# Patient Record
Sex: Female | Born: 1985 | Race: White | Hispanic: No | Marital: Married | Smoking: Never smoker
Health system: Southern US, Community
[De-identification: ages and names within clinical notes are randomized; demographics above are authoritative.]

## PROBLEM LIST (undated history)

## (undated) DIAGNOSIS — E282 Polycystic ovarian syndrome: Secondary | ICD-10-CM

## (undated) DIAGNOSIS — F419 Anxiety disorder, unspecified: Secondary | ICD-10-CM

## (undated) DIAGNOSIS — R51 Headache: Secondary | ICD-10-CM

## (undated) DIAGNOSIS — E039 Hypothyroidism, unspecified: Secondary | ICD-10-CM

## (undated) DIAGNOSIS — IMO0001 Reserved for inherently not codable concepts without codable children: Secondary | ICD-10-CM

## (undated) DIAGNOSIS — M1990 Unspecified osteoarthritis, unspecified site: Secondary | ICD-10-CM

## (undated) DIAGNOSIS — I499 Cardiac arrhythmia, unspecified: Secondary | ICD-10-CM

## (undated) DIAGNOSIS — K219 Gastro-esophageal reflux disease without esophagitis: Secondary | ICD-10-CM

## (undated) DIAGNOSIS — R569 Unspecified convulsions: Secondary | ICD-10-CM

## (undated) DIAGNOSIS — E669 Obesity, unspecified: Secondary | ICD-10-CM

## (undated) HISTORY — DX: Gastro-esophageal reflux disease without esophagitis: K21.9

## (undated) HISTORY — DX: Unspecified osteoarthritis, unspecified site: M19.90

## (undated) HISTORY — PX: WRIST FRACTURE SURGERY: SHX121

## (undated) HISTORY — DX: Polycystic ovarian syndrome: E28.2

## (undated) HISTORY — DX: Headache: R51

## (undated) HISTORY — DX: Hypothyroidism, unspecified: E03.9

## (undated) HISTORY — DX: Reserved for inherently not codable concepts without codable children: IMO0001

## (undated) HISTORY — DX: Anxiety disorder, unspecified: F41.9

## (undated) HISTORY — DX: Obesity, unspecified: E66.9

## (undated) HISTORY — PX: WISDOM TOOTH EXTRACTION: SHX21

---

## 2010-07-27 ENCOUNTER — Ambulatory Visit: Payer: Self-pay | Admitting: Obstetrics & Gynecology

## 2010-07-28 ENCOUNTER — Encounter: Payer: Self-pay | Admitting: Obstetrics & Gynecology

## 2010-07-28 LAB — CONVERTED CEMR LAB
Cholesterol: 134 mg/dL (ref 0–200)
Glucose, Bld: 98 mg/dL (ref 70–99)
HDL: 47 mg/dL (ref >39)
TSH: 1.503 microintl units/mL (ref 0.350–4.500)
Total CHOL/HDL Ratio: 2.9
VLDL: 18 mg/dL (ref 0–40)

## 2010-08-03 ENCOUNTER — Ambulatory Visit: Payer: Self-pay | Admitting: Obstetrics & Gynecology

## 2010-10-03 ENCOUNTER — Ambulatory Visit: Payer: Self-pay | Admitting: Advanced Practice Midwife

## 2011-04-25 NOTE — Assessment & Plan Note (Signed)
NAMESTARLA, DELLER                    ACCOUNT NO.:  [id number]   MEDICAL RECORD NO.:  [id number]          PATIENT TYPE:  POB   LOCATION:  [facility] at [place]         FACILITY:  [facility]   PHYSICIAN:  Allie Bossier, MD        DATE OF BIRTH:  08-25-1986   DATE OF SERVICE:  07/27/2010                                  CLINIC NOTE   Ms. Caryn Section is a 25 year old single white gravida 0 who is seen here for GYN  exam.  Her GYN concerns are that she recently stopped her birth control  pill because her periods were becoming lengthier and heavier on the  pill.  Please note she had been placed on the pill by her primary care  doctor for the diagnosis of PCOS, to treat her oligomenorrhea followed  by dysfunctional uterine bleeding.  She is currently using withdrawal  for birth control.   PAST MEDICAL HISTORY:  Obesity and PCOS.   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  She is sexually active and monogamous for the last  18 years.  She denies dyspareunia.  The rest of review of systems  questions are negative.  Her last Pap smear was August 2010 and that she  has always had normal Pap smears.  She tells me she has lost 30 pounds  in the last 6 months by changing her eating habits and working out.   MEDICATIONS:  Advil p.r.n.   PAST SURGICAL HISTORY:  Left wrist ORIF.   FAMILY HISTORY:  Negative for breast, GYN and colon malignancies,  coronary artery disease, and skin cancer in her family.   She is allergic to LATEX.  Medicine allergy will be SULFA.   SOCIAL HISTORY:  She reports rare/social alcohol.  She denies tobacco or  illegal drug use.   PHYSICAL EXAMINATION:  VITAL SIGNS:  Height 5 feet 2 inches, weight 230  pounds, blood pressure 127/77, pulse 81.  HEART:  Regular rate and rhythm.  LUNGS:  Clear to auscultation bilaterally.  BREASTS:  Normal bilaterally.  ABDOMEN:  Obese.  No palpable hepatosplenomegaly.  PELVIC:  External genitalia shaved.  No  lesions.  Cervix, nulliparous.  Normal discharge.  Uterus is upper limits of normal for size, normal  shape and mobile and nontender.  Adnexa are nontender.  No masses.   ASSESSMENT AND PLAN:  1. Annual exam.  Checked a Pap smear.  Recommended continued weight      loss.  Recommended self-breast and self-vulvar exams monthly.  2. Health maintenance.  I have recommended the Gardasil vaccination      and given her information.  3. polycystic ovarian syndrome.  She will return for fasting lipids      and TSH and glucose.  4. Control of oligomenorrhea/birth control.  Given her information on      Mirena intrauterine device and said that if the Mirena does not      suit her, I would suggest that she restart the birth control pills.      Allie Bossier, MD     MCD/MEDQ  D:  07/27/2010  T:  07/28/2010  Job:  [id number]

## 2011-04-25 NOTE — Assessment & Plan Note (Signed)
NAMEMAKARI, SANKO NO.:  [id number]   MEDICAL RECORD NO.:  [id number]          PATIENT TYPE:  POB   LOCATION:  [facility] at [place]         FACILITY:  [facility]   PHYSICIAN:  Wynelle Bourgeois, CNM    DATE OF BIRTH:  1986-02-01   DATE OF SERVICE:  10/03/2010                                  CLINIC NOTE   This is a 25 year old gravida 0 who presents for followup of new start  on birth control pills.  She was seen in August by Dr. Marice Potter for  polycystic ovarian syndrome and dysfunctional uterine bleeding and Pap  smear was done at that time, weight loss was recommended and she had  some labs done and they discussed birth control pills and Mirena IUD.  The patient decided to start taking Lo/Ovral birth control pills.  She  took them for 1 month during September and had her period as expected on  October 7, but states that she has continued to have spotting, brown and  red since the end of her period.  She does admit to taking only the  active pills in the pack and not taking the week of inactive pills.  She  then starts her new pack immediately after finishing third week of the  previous pack.  She also requests her second Gardasil vaccination today.   OBJECTIVE:  VITAL SIGNS:  Pulse 77, blood pressure 111/79, weight 217  which is down from 230 in August, height 62 inches.  ABDOMEN:  Soft and nontender.  PELVIC:  Deferred per patient preference.  She states that her bleeding  is only light and it is mostly brown.   LABORATORY DATA:  Her cholesterol was 134, triglycerides 89, HDL 47, LDL  69, all within normal limits.  Glucose was 98 and TSH was normal at  1.503.   ASSESSMENT:  1. Polycystic ovarian syndrome  2. Dysfunctional bleeding secondary to polycystic ovarian syndrome.  3. Oral contraceptive pill use, here for blood pressure check.   PLAN:  We discussed continuous use of birth control pills and the fact  that they may contribute to irregular bleeding due to  inadequate  shedding of the lining.  The patient states that if she lets herself  have a period at the end of the pack that the bleeding will be so heavy  that she cannot endure it.  I suggested that she let herself have a full  period at least every 2 to 3 months just to make sure that her uterus is  getting cleaned out.  We also discussed PCOS and the oligoovulation that  accompanies that and the patient understands and wants to continue the  Lo/Ovral birth control pills.  We discussed Mirena IUD again and the  patient does not want to proceed with that at this time, and so we will  continue the previous plan.  The patient will continue her weight loss  efforts, which will probably improve some of her PCOS symptoms, and we  will see her back as needed for her annual exam.          ______________________________  Wynelle Bourgeois, CNM    MW/MEDQ  D:  10/03/2010  T:  10/04/2010  Job:  [id number]

## 2011-09-12 ENCOUNTER — Encounter: Payer: Self-pay | Admitting: *Deleted

## 2011-09-12 ENCOUNTER — Ambulatory Visit (INDEPENDENT_AMBULATORY_CARE_PROVIDER_SITE_OTHER): Payer: Commercial Managed Care - PPO | Admitting: Obstetrics & Gynecology

## 2011-09-12 ENCOUNTER — Encounter: Payer: Self-pay | Admitting: Obstetrics & Gynecology

## 2011-09-12 VITALS — BP 113/77 | HR 79 | Temp 98.3°F | Resp 16 | Ht 62.0 in | Wt 198.0 lb

## 2011-09-12 DIAGNOSIS — Z1272 Encounter for screening for malignant neoplasm of vagina: Secondary | ICD-10-CM

## 2011-09-12 DIAGNOSIS — Z01419 Encounter for gynecological examination (general) (routine) without abnormal findings: Secondary | ICD-10-CM

## 2011-09-12 DIAGNOSIS — Z0000 Encounter for general adult medical examination without abnormal findings: Secondary | ICD-10-CM

## 2011-09-12 DIAGNOSIS — Z113 Encounter for screening for infections with a predominantly sexual mode of transmission: Secondary | ICD-10-CM

## 2011-09-12 NOTE — Progress Notes (Signed)
  Subjective:    Patient ID: Kristy Rodgers, female    DOB: [birth year], 25 y.o.   MRN: [id number]  HPI    Review of Systems     Objective:   Physical Exam        Assessment & Plan:   Subjective:    Kristy Rodgers is a 25 y.o. female who presents for an annual exam. The patient has no complaints today. The patient is sexually active. GYN screening history: last pap: was normal. The patient wears seatbelts: yes. The patient participates in regular exercise: yes. Has the patient ever been transfused or tattooed?: yes. (tattoo) The patient reports that there is not domestic violence in her life. She has a new sexual partner since 12/11.  They live together. She has occasional dysparunia.  Menstrual History: OB History    Grav Para Term Preterm Abortions TAB SAB Ect Mult Living   0 0 0 0 0 0 0 0 0 0       Menarche age: **51 Patient's last menstrual period was 08/27/2011.    The following portions of the patient's history were reviewed and updated as appropriate: allergies, current medications, past family history, past medical history, past social history, past surgical history and problem list.  Review of Systems A comprehensive review of systems was negative.   She is now a Sales executive. She has not skipped any periods this year in spite of stopping her OCPs. Objective:    BP 113/77  Pulse 79  Temp(Src) 98.3 F (36.8 C) (Oral)  Resp 16  Ht 5\' 2"  (1.575 m)  Wt 198 lb (89.812 kg)  BMI 36.21 kg/m2  LMP 08/27/2011  General Appearance:    Alert, cooperative, no distress, appears stated age  Head:    Normocephalic, without obvious abnormality, atraumatic  Eyes:    PERRL, conjunctiva/corneas clear, EOM's intact, fundi    benign, both eyes  Ears:    Normal TM's and external ear canals, both ears  Nose:   Nares normal, septum midline, mucosa normal, no drainage    or sinus tenderness  Throat:   Lips, mucosa, and tongue normal; teeth and gums normal  Neck:   Supple,  symmetrical, trachea midline, no adenopathy;    thyroid:  no enlargement/tenderness/nodules; no carotid   bruit or JVD  Back:     Symmetric, no curvature, ROM normal, no CVA tenderness  Lungs:     Clear to auscultation bilaterally, respirations unlabored  Chest Wall:    No tenderness or deformity   Heart:    Regular rate and rhythm, S1 and S2 normal, no murmur, rub   or gallop  Breast Exam:    No tenderness, masses, or nipple abnormality  Abdomen:     Soft, non-tender, bowel sounds active all four quadrants,    no masses, no organomegaly  Genitalia:    Normal female without lesion, discharge or tenderness, NSSA, NT, no adnexal masses  Rectal:      Extremities:   Extremities normal, atraumatic, no cyanosis or edema  Pulses:   2+ and symmetric all extremities  Skin:   Skin color, texture, turgor normal, no rashes or lesions  Lymph nodes:   Cervical, supraclavicular, and axillary nodes normal  Neurologic:   CNII-XII intact, normal strength, sensation and reflexes    throughout  .    Assessment:    Healthy female exam.    Plan:     Await pap smear results.  Rec SBE/SVE

## 2012-10-08 ENCOUNTER — Ambulatory Visit: Payer: Commercial Managed Care - PPO | Admitting: Obstetrics & Gynecology

## 2013-02-12 ENCOUNTER — Encounter (HOSPITAL_BASED_OUTPATIENT_CLINIC_OR_DEPARTMENT_OTHER): Payer: Self-pay

## 2013-02-12 ENCOUNTER — Emergency Department (HOSPITAL_BASED_OUTPATIENT_CLINIC_OR_DEPARTMENT_OTHER)
Admission: EM | Admit: 2013-02-12 | Discharge: 2013-02-12 | Disposition: A | Discharge status: Home / Self Care | Payer: Commercial Managed Care - PPO | Attending: Emergency Medicine | Admitting: Emergency Medicine

## 2013-02-12 ENCOUNTER — Telehealth: Payer: Self-pay | Admitting: *Deleted

## 2013-02-12 ENCOUNTER — Emergency Department (HOSPITAL_BASED_OUTPATIENT_CLINIC_OR_DEPARTMENT_OTHER): Payer: Commercial Managed Care - PPO

## 2013-02-12 DIAGNOSIS — Z8719 Personal history of other diseases of the digestive system: Secondary | ICD-10-CM | POA: Insufficient documentation

## 2013-02-12 DIAGNOSIS — Z3202 Encounter for pregnancy test, result negative: Secondary | ICD-10-CM | POA: Insufficient documentation

## 2013-02-12 DIAGNOSIS — Z8639 Personal history of other endocrine, nutritional and metabolic disease: Secondary | ICD-10-CM | POA: Insufficient documentation

## 2013-02-12 DIAGNOSIS — G40909 Epilepsy, unspecified, not intractable, without status epilepticus: Secondary | ICD-10-CM | POA: Insufficient documentation

## 2013-02-12 DIAGNOSIS — R109 Unspecified abdominal pain: Secondary | ICD-10-CM | POA: Insufficient documentation

## 2013-02-12 DIAGNOSIS — N949 Unspecified condition associated with female genital organs and menstrual cycle: Secondary | ICD-10-CM | POA: Insufficient documentation

## 2013-02-12 DIAGNOSIS — N946 Dysmenorrhea, unspecified: Secondary | ICD-10-CM | POA: Insufficient documentation

## 2013-02-12 DIAGNOSIS — Z8742 Personal history of other diseases of the female genital tract: Secondary | ICD-10-CM | POA: Insufficient documentation

## 2013-02-12 DIAGNOSIS — E669 Obesity, unspecified: Secondary | ICD-10-CM | POA: Insufficient documentation

## 2013-02-12 DIAGNOSIS — N898 Other specified noninflammatory disorders of vagina: Secondary | ICD-10-CM | POA: Insufficient documentation

## 2013-02-12 DIAGNOSIS — Z862 Personal history of diseases of the blood and blood-forming organs and certain disorders involving the immune mechanism: Secondary | ICD-10-CM | POA: Insufficient documentation

## 2013-02-12 DIAGNOSIS — Z79899 Other long term (current) drug therapy: Secondary | ICD-10-CM | POA: Insufficient documentation

## 2013-02-12 DIAGNOSIS — Z8739 Personal history of other diseases of the musculoskeletal system and connective tissue: Secondary | ICD-10-CM | POA: Insufficient documentation

## 2013-02-12 DIAGNOSIS — F411 Generalized anxiety disorder: Secondary | ICD-10-CM | POA: Insufficient documentation

## 2013-02-12 HISTORY — DX: Unspecified convulsions: R56.9

## 2013-02-12 LAB — URINALYSIS, ROUTINE W REFLEX MICROSCOPIC
Bilirubin Urine: NEGATIVE
Glucose, UA: NEGATIVE mg/dL
Specific Gravity, Urine: 1.005 (ref 1.005–1.030)
Urobilinogen, UA: 0.2 mg/dL (ref 0.0–1.0)

## 2013-02-12 LAB — COMPREHENSIVE METABOLIC PANEL
CO2: 24 mEq/L (ref 19–32)
Calcium: 9 mg/dL (ref 8.4–10.5)
Creatinine, Ser: 0.7 mg/dL (ref 0.50–1.10)
GFR calc Af Amer: >90 mL/min (ref >90)
GFR calc non Af Amer: >90 mL/min (ref >90)
Glucose, Bld: 104 mg/dL — ABNORMAL HIGH (ref 70–99)

## 2013-02-12 LAB — CBC WITH DIFFERENTIAL/PLATELET
Basophils Relative: 0 % (ref 0–1)
Hemoglobin: 12.8 g/dL (ref 12.0–15.0)
Lymphocytes Relative: 38 % (ref 12–46)
Lymphs Abs: 2.2 10*3/uL (ref 0.7–4.0)
Monocytes Relative: 11 % (ref 3–12)
Neutro Abs: 3 10*3/uL (ref 1.7–7.7)
Neutrophils Relative %: 51 % (ref 43–77)
RBC: 4.41 MIL/uL (ref 3.87–5.11)
WBC: 5.9 10*3/uL (ref 4.0–10.5)

## 2013-02-12 LAB — URINE MICROSCOPIC-ADD ON

## 2013-02-12 LAB — WET PREP, GENITAL: Yeast Wet Prep HPF POC: NONE SEEN

## 2013-02-12 MED ORDER — TRAMADOL HCL 50 MG PO TABS: 50.0000 mg | ORAL_TABLET | Freq: Four times a day (QID) | ORAL | Status: DC | PRN

## 2013-02-12 MED ORDER — KETOROLAC TROMETHAMINE 60 MG/2ML IM SOLN
60.0000 mg | Freq: Once | INTRAMUSCULAR | Status: AC
  Administered 2013-02-12: 60 mg via INTRAMUSCULAR
  Filled 2013-02-12: qty 2

## 2013-02-12 MED ORDER — IBUPROFEN 600 MG PO TABS: 600.0000 mg | ORAL_TABLET | Freq: Four times a day (QID) | ORAL | Status: DC | PRN

## 2013-02-12 NOTE — ED Notes (Signed)
Called into pt's room-pt asked what her WBC count was and states she does not want to have CT scan done at this time due to financial-EDP notified

## 2013-02-12 NOTE — ED Notes (Signed)
Pt agreeable to pelvic exam

## 2013-02-12 NOTE — ED Notes (Signed)
Pt reports abdominal pain that started this am and she is presently on menstrual cycle.  Pain is unrelieved after taking Aleve.

## 2013-02-12 NOTE — ED Provider Notes (Signed)
History     CSN: [id number]  Arrival date & time 02/12/13  1623   First MD Initiated Contact with Patient 02/12/13 1640      Chief Complaint  Patient presents with  . Abdominal Pain    (Consider location/radiation/quality/duration/timing/severity/associated sxs/prior treatment) HPI Pt with history of irregular periods and menstrual cramping states that she started her period yesterday and began having right pelvic pain today. Pt states the pain varies in intensity but is constantly present. No flank pain, fever, chills. No N/V/D. Pt took Aleve with no improvement. No urinary changes.  Past Medical History  Diagnosis Date  . Polycystic ovarian syndrome   . Obesity   . Arthritis   . Reflux   . Anxiety   . Hypothyroid   . Seizures     Past Surgical History  Procedure Laterality Date  . Wrist fracture surgery      age 36    Family History  Problem Relation Age of Onset  . Cancer Mother     skin  . Fibroids Mother     uterine  . Cancer Paternal Grandmother     breast    History  Substance Use Topics  . Smoking status: Never Smoker   . Smokeless tobacco: Never Used  . Alcohol Use: Yes     Comment: socially but rarely    OB History   Grav Para Term Preterm Abortions TAB SAB Ect Mult Living   0 0 0 0 0 0 0 0 0 0       Review of Systems  Constitutional: Negative for fever and chills.  Cardiovascular: Negative for chest pain.  Gastrointestinal: Positive for abdominal pain. Negative for nausea, vomiting, diarrhea and constipation.  Genitourinary: Positive for vaginal bleeding, menstrual problem and pelvic pain. Negative for dysuria, frequency, hematuria, vaginal discharge and vaginal pain.  Musculoskeletal: Negative for myalgias and back pain.  Skin: Negative for rash and wound.  Neurological: Negative for dizziness, weakness, light-headedness, numbness and headaches.  All other systems reviewed and are negative.    Allergies  Latex and Sulfa  antibiotics  Home Medications   Current Outpatient Rx  Name  Route  Sig  Dispense  Refill  . topiramate (TOPAMAX) 25 MG capsule   Oral   Take 25 mg by mouth 2 (two) times daily.         Marland Kitchen ibuprofen (ADVIL,MOTRIN) 200 MG tablet   Oral   Take 200 mg by mouth every 6 (six) hours as needed.           Marland Kitchen ibuprofen (ADVIL,MOTRIN) 600 MG tablet   Oral   Take 1 tablet (600 mg total) by mouth every 6 (six) hours as needed for pain.   30 tablet   0   . traMADol (ULTRAM) 50 MG tablet   Oral   Take 1 tablet (50 mg total) by mouth every 6 (six) hours as needed for pain.   15 tablet   0     BP 132/74  Pulse 94  Temp(Src) 98.1 F (36.7 C) (Oral)  Resp 16  SpO2 97%  LMP 02/12/2013  Physical Exam  Nursing note and vitals reviewed. Constitutional: She is oriented to person, place, and time. She appears well-developed and well-nourished. No distress.  HENT:  Head: Normocephalic and atraumatic.  Mouth/Throat: Oropharynx is clear and moist.  Eyes: EOM are normal. Pupils are equal, round, and reactive to light.  Neck: Normal range of motion. Neck supple.  Cardiovascular: Normal rate and regular rhythm.  Pulmonary/Chest: Effort normal and breath sounds normal. No respiratory distress. She has no wheezes. She has no rales.  Abdominal: Soft. Bowel sounds are normal. She exhibits no distension and no mass. There is tenderness (Diffuse lower abd tenderness, worse in RLQ, R pelvis. No rebound or guarding). There is no rebound and no guarding.  Musculoskeletal: Normal range of motion. She exhibits no edema and no tenderness.  Neurological: She is alert and oriented to person, place, and time.  Skin: Skin is warm and dry. No rash noted. No erythema.  Psychiatric: She has a normal mood and affect. Her behavior is normal.    ED Course  Procedures (including critical care time)  Labs Reviewed  WET PREP, GENITAL - Abnormal; Notable for the following:    WBC, Wet Prep HPF POC RARE (*)     All other components within normal limits  URINALYSIS, ROUTINE W REFLEX MICROSCOPIC - Abnormal; Notable for the following:    Hgb urine dipstick LARGE (*)    All other components within normal limits  COMPREHENSIVE METABOLIC PANEL - Abnormal; Notable for the following:    Glucose, Bld 104 (*)    Total Bilirubin 0.2 (*)    All other components within normal limits  GC/CHLAMYDIA PROBE AMP  PREGNANCY, URINE  CBC WITH DIFFERENTIAL  URINE MICROSCOPIC-ADD ON   US Transvaginal Non-ob  02/12/2013  *RADIOLOGY REPORT*  Clinical Data: 27 year old female with right side pain.  History of polycystic ovarian syndrome.  LMP 02/12/2013.    TRANSABDOMINAL AND TRANSVAGINAL ULTRASOUND OF PELVIS  Technique:  Both transabdominal and transvaginal ultrasound examinations of the pelvis were performed. Transabdominal technique was performed for global imaging of the pelvis including uterus, ovaries, adnexal regions, and pelvic cul-de-sac.  It was necessary to proceed with endovaginal exam following the transabdominal exam to visualize the adnexa.  Comparison:  None  Findings:  Uterus: Normal measuring 6.6 x 3.4 x 5.0 cm. Occasional Nabothian cyst.  Endometrium: Homogeneous and diminutive up to 2 mm in thickness.  Right ovary:  Normal with a single small cyst or follicle.  2.6 x 1.9 x 1.7 cm.  Color Doppler flow evident.  Left ovary: Normal by transabdominal ultrasound measuring 3.2 x 1.6 x 2.1 cm.  Color Doppler flow evident.  Other findings: Trace simple appearing pelvic free fluid.  IMPRESSION:  Normal study. No evidence of pelvic mass or other significant abnormality.   Original Report Authenticated By: Erskine Speed, M.D.    US Pelvis Complete  02/12/2013  *RADIOLOGY REPORT*  Clinical Data: Right adnexal pain  TRANSABDOMINAL AND TRANSVAGINAL ULTRASOUND OF PELVIS Technique:  Both transabdominal and transvaginal ultrasound examinations of the pelvis were performed. Transabdominal technique was performed for global imaging of  the pelvis including uterus, ovaries, adnexal regions, and pelvic cul-de-sac.  It was necessary to proceed with endovaginal exam following the transabdominal exam to visualize the ovaries.  Comparison:  None  Findings:  Uterus: Normal in size and echogenicity measuring 6.6 x 3.4 x 5.0 cm.  Endometrium: Normal thickness at 2.4 mm.  Right ovary:  Normal in size and echogenicity with small follicles measuring 2.6 x 1.9 x 1.7 cm.  Left ovary: Normal sized echogenicity measuring 3.2 x 1.6 x 2.1 cm.  Other findings: Trace free fluid  IMPRESSION:   Normal uterus and ovaries.   Original Report Authenticated By: Genevive Bi, M.D.      1. Abdominal pain   2. Menses painful       MDM   Pain is improved with toradol.  US WNL. Normal labs including WBC. Pelvic exam with R adnexal ttp. No CMT. +small amount of bleeding from cervical os.  Discussed further imagining to R/o cause of pain, specifically appendicitis. Pt states she does not want to get the CT at this time. She is aware that though appendicitis is unlikely given exam, normal WBC that is can not be ruled out. She is advised to return for worsening pain, fever, or any concerns.        Loren Racer, MD 02/12/13 [phone]

## 2013-02-12 NOTE — Telephone Encounter (Signed)
Pt called stating that she was having severe Rt pelvic pain.  Pain scale a 9 today.  She stated home from work today due to the pain.  She states that the pain brings her to her knees.  Suggested she goes to [facility] or [facility] today.

## 2013-02-12 NOTE — ED Notes (Signed)
Pt states she does like to undress "i don't like for anybody to see me"-became tearful-refused pain med injection to DG area

## 2013-02-12 NOTE — ED Notes (Signed)
EDP was back in with pt-pt was advised to return prn-agreed

## 2013-02-13 LAB — GC/CHLAMYDIA PROBE AMP: GC Probe RNA: NEGATIVE

## 2013-08-21 ENCOUNTER — Ambulatory Visit: Payer: Commercial Managed Care - PPO | Admitting: Obstetrics & Gynecology

## 2013-12-10 ENCOUNTER — Encounter: Payer: Self-pay | Admitting: Obstetrics & Gynecology

## 2013-12-10 ENCOUNTER — Ambulatory Visit (INDEPENDENT_AMBULATORY_CARE_PROVIDER_SITE_OTHER): Payer: Commercial Managed Care - PPO | Admitting: Obstetrics & Gynecology

## 2013-12-10 VITALS — BP 107/66 | HR 81 | Resp 16 | Ht 62.0 in | Wt 205.0 lb

## 2013-12-10 DIAGNOSIS — Z3169 Encounter for other general counseling and advice on procreation: Secondary | ICD-10-CM

## 2013-12-10 DIAGNOSIS — Z01419 Encounter for gynecological examination (general) (routine) without abnormal findings: Secondary | ICD-10-CM

## 2013-12-10 DIAGNOSIS — N946 Dysmenorrhea, unspecified: Secondary | ICD-10-CM

## 2013-12-10 DIAGNOSIS — N920 Excessive and frequent menstruation with regular cycle: Secondary | ICD-10-CM

## 2013-12-10 DIAGNOSIS — Z124 Encounter for screening for malignant neoplasm of cervix: Secondary | ICD-10-CM

## 2013-12-10 MED ORDER — TRANEXAMIC ACID 650 MG PO TABS: 1300.0000 mg | ORAL_TABLET | Freq: Three times a day (TID) | ORAL | Status: DC

## 2013-12-10 MED ORDER — FOLIC ACID 1 MG PO TABS: 1.0000 mg | ORAL_TABLET | Freq: Every day | ORAL | Status: DC

## 2013-12-10 MED ORDER — PRENATAL VITAMINS 0.8 MG PO TABS: 1.0000 | ORAL_TABLET | Freq: Every day | ORAL | Status: DC

## 2013-12-10 MED ORDER — MELOXICAM 7.5 MG PO TABS: 7.5000 mg | ORAL_TABLET | Freq: Two times a day (BID) | ORAL | Status: DC | PRN

## 2013-12-10 NOTE — Progress Notes (Signed)
Subjective:     Kristy Rodgers is a 27 y.o.G0 female and is here for a comprehensive physical exam.  The patient reports having heavy, painful, regular menstrual periods.  She has a history of PCOS, and wants to know what she she can take to help with her symptoms. On the first day of her period, she soaks through a super tampon in 1.5 hours.  Denies any anemia symptoms.    Patient wants to attempt conception.  She has a seizure disorder and is on Lamotrigine.  Her neurologist is considering switching her to another AED for pregnancy.    History   Social History  . Marital Status: Married    Spouse Name: N/A    Number of Children: N/A  . Years of Education: N/A   Occupational History  . student     dental asst program @ [employer]   Social History Main Topics  . Smoking status: Never Smoker   . Smokeless tobacco: Never Used  . Alcohol Use: Yes     Comment: socially but rarely  . Drug Use: No  . Sexual Activity: Yes    Partners: Male    Birth Control/ Protection: OCP   Other Topics Concern  . Not on file   Social History Narrative  . No narrative on file   Health Maintenance  Topic Date Due  . Tetanus/tdap  04/29/2006  . Influenza Vaccine  07/11/2013  . Pap Smear  09/11/2014    The following portions of the patient's history were reviewed and updated as appropriate: allergies, current medications, past family history, past medical history, past social history, past surgical history and problem list.  Review of Systems Pertinent items are noted in HPI.   Objective:   BP 107/66  Pulse 81  Resp 16  Ht 5\' 2"  (1.575 m)  Wt 205 lb (92.987 kg)  BMI 37.49 kg/m2  LMP 11/20/2013 GENERAL: Well-developed, well-nourished female in no acute distress.  HEENT: Normocephalic, atraumatic. Sclerae anicteric.  NECK: Supple. Normal thyroid.  LUNGS: Clear to auscultation bilaterally.  HEART: Regular rate and rhythm. BREASTS: Symmetric in size. No masses, skin changes,  nipple drainage, or lymphadenopathy. ABDOMEN: Soft, nontender, nondistended. No organomegaly. PELVIC: Normal external female genitalia. Vagina is pink and rugated.  Normal discharge. Normal cervix contour. Pap smear obtained. Uterus is normal in size. No adnexal mass or tenderness.  EXTREMITIES: No cyanosis, clubbing, or edema, 2+ distal pulses.    Assessment and Plan:   1. Routine gynecological examination - Cytology - PAP  2. Pre-conception counseling Will follow up with neurologist, regarding AED therapy. Recommended weight loss to help with maximizing chances of conception, reducing pregnancy related obesity risks. - folic acid (FOLVITE) 1 MG tablet; Take 1 tablet (1 mg total) by mouth daily.  Dispense: 30 tablet; Refill: 10 - Prenatal Multivit-Min-Fe-FA (PRENATAL VITAMINS) 0.8 MG tablet; Take 1 tablet by mouth daily.  Dispense: 30 tablet; Refill: 12  3. Menorrhagia Normal pelvic ultrasound in 02/2013 - tranexamic acid (LYSTEDA) 650 MG TABS tablet; Take 2 tablets (1,300 mg total) by mouth 3 (three) times daily. Take during menses for a maximum of five days  Dispense: 30 tablet; Refill: 2 - meloxicam (MOBIC) 7.5 MG tablet; Take 1 tablet (7.5 mg total) by mouth 2 (two) times daily as needed for pain. Please take with food  Dispense: 30 tablet; Refill: 2  4. Dysmenorrhea - meloxicam (MOBIC) 7.5 MG tablet; Take 1 tablet (7.5 mg total) by mouth 2 (two) times daily as  needed for pain. Please take with food  Dispense: 30 tablet; Refill: 2   Jaynie Collins, MD, FACOG Attending Obstetrician & Gynecologist Faculty Practice, [facility]

## 2013-12-10 NOTE — Patient Instructions (Addendum)
Menorrhagia Dysfunctional uterine bleeding is different from a normal menstrual period. When periods are heavy or there is more bleeding than is usual for you, it is called menorrhagia. It may be caused by hormonal imbalance, or physical, metabolic, or other problems. Examination is necessary in order that your caregiver may treat treatable causes. If this is a continuing problem, a D&C may be needed. That means that the cervix (the opening of the uterus or womb) is dilated (stretched larger) and the lining of the uterus is scraped out. The tissue scraped out is then examined under a microscope by a specialist (pathologist) to make sure there is nothing of concern that needs further or more extensive treatment. HOME CARE INSTRUCTIONS   If medications were prescribed, take exactly as directed. Do not change or switch medications without consulting your caregiver.  Long term heavy bleeding may result in iron deficiency. Your caregiver may have prescribed iron pills. They help replace the iron your body lost from heavy bleeding. Take exactly as directed. Iron may cause constipation. If this becomes a problem, increase the bran, fruits, and roughage in your diet.  Do not take aspirin or medicines that contain aspirin one week before or during your menstrual period. Aspirin may make the bleeding worse.  If you need to change your sanitary pad or tampon more than once every 2 hours, stay in bed and rest as much as possible until the bleeding stops.  Eat well-balanced meals. Eat foods high in iron. Examples are leafy green vegetables, meat, liver, eggs, and whole grain breads and cereals. Do not try to lose weight until the abnormal bleeding has stopped and your blood iron level is back to normal. SEEK MEDICAL CARE IF:   You need to change your sanitary pad or tampon more than once an hour.  You develop nausea (feeling sick to your stomach) and vomiting, dizziness, or diarrhea while you are taking your  medicine.  You have any problems that may be related to the medicine you are taking. SEEK IMMEDIATE MEDICAL CARE IF:   You have a fever.  You develop chills.  You develop severe bleeding or start to pass blood clots.  You feel dizzy or faint. MAKE SURE YOU:   Understand these instructions.  Will watch your condition.  Will get help right away if you are not doing well or get worse. Document Released: 11/27/2005 Document Revised: 02/19/2012 Document Reviewed: 05/18/2013 [facility] Patient Information 2014 [place], [place]. Dysmenorrhea Menstrual cramps (dysmenorrhea) are caused by the muscles of the uterus tightening (contracting) during a menstrual period. For some women, this discomfort is merely bothersome. For others, dysmenorrhea can be severe enough to interfere with everyday activities for a few days each month. Primary dysmenorrhea is menstrual cramps that last a couple of days when you start having menstrual periods or soon after. This often begins after a teenager starts having her period. As a woman gets older or has a baby, the cramps will usually lessen or disappear. Secondary dysmenorrhea begins later in life, lasts longer, and the pain may be stronger than primary dysmenorrhea. The pain may start before the period and last a few days after the period.  CAUSES  Dysmenorrhea is usually caused by an underlying problem, such as:  The tissue lining the uterus grows outside of the uterus in other areas of the body (endometriosis).  The endometrial tissue, which normally lines the uterus, is found in or grows into the muscular walls of the uterus (adenomyosis).  The pelvic blood  vessels are engorged with blood just before the menstrual period (pelvic congestive syndrome).  Overgrowth of cells (polyps) in the lining of the uterus or cervix.  Falling down of the uterus (prolapse) because of loose or stretched ligaments.  Depression.  Bladder problems, infection, or  inflammation.  Problems with the intestine, a tumor, or irritable bowel syndrome.  Cancer of the female organs or bladder.  A severely tipped uterus.  A very tight opening or closed cervix.  Noncancerous tumors of the uterus (fibroids).  Pelvic inflammatory disease (PID).  Pelvic scarring (adhesions) from a previous surgery.  Ovarian cyst.  An intrauterine device (IUD) used for birth control. RISK FACTORS You may be at greater risk of dysmenorrhea if:  You are younger than age 88.  You started puberty early.  You have irregular or heavy bleeding.  You have never given birth.  You have a family history of this problem.  You are a smoker. SIGNS AND SYMPTOMS   Cramping or throbbing pain in your lower abdomen.  Headaches.  Lower back pain.  Nausea or vomiting.  Diarrhea.  Sweating or dizziness.  Loose stools. DIAGNOSIS  A diagnosis is based on your history, symptoms, physical exam, diagnostic tests, or procedures. Diagnostic tests or procedures may include:  Blood tests.  Ultrasonography.  An examination of the lining of the uterus (dilation and curettage, D&C).  An examination inside your abdomen or pelvis with a scope (laparoscopy).  X-rays.  CT scan.  MRI.  An examination inside the bladder with a scope (cystoscopy).  An examination inside the intestine or stomach with a scope (colonoscopy, gastroscopy). TREATMENT  Treatment depends on the cause of the dysmenorrhea. Treatment may include:  Pain medicine prescribed by your health care provider.  Birth control pills or an IUD with progesterone hormone in it.  Hormone replacement therapy.  Nonsteroidal anti-inflammatory drugs (NSAIDs). These may help stop the production of prostaglandins.  Surgery to remove adhesions, endometriosis, ovarian cyst, or fibroids.  Removal of the uterus (hysterectomy).  Progesterone shots to stop the menstrual period.  Cutting the nerves on the sacrum that  go to the female organs (presacral neurectomy).  Electric current to the sacral nerves (sacral nerve stimulation).  Antidepressant medicine.  Psychiatric therapy, counseling, or group therapy.  Exercise and physical therapy.  Meditation and yoga therapy.  Acupuncture. HOME CARE INSTRUCTIONS   Only take over-the-counter or prescription medicines as directed by your health care provider.  Place a heating pad or hot water bottle on your lower back or abdomen. Do not sleep with the heating pad.  Use aerobic exercises, walking, swimming, biking, and other exercises to help lessen the cramping.  Massage to the lower back or abdomen may help.  Stop smoking.  Avoid alcohol and caffeine. SEEK MEDICAL CARE IF:   Your pain does not get better with medicine.  You have pain with sexual intercourse.  Your pain increases and is not controlled with medicines.  You have abnormal vaginal bleeding with your period.  You develop nausea or vomiting with your period that is not controlled with medicine. SEEK IMMEDIATE MEDICAL CARE IF:  You pass out.  Document Released: 11/27/2005 Document Revised: 07/30/2013 Document Reviewed: 05/15/2013 [facility] Patient Information 2014 [place], [place].

## 2013-12-11 NOTE — L&D Delivery Note (Signed)
Delivery Note At 4:45 AM a viable and healthy female was delivered via Vaginal, Spontaneous Delivery (Presentation: LOA ).  Loose nuchal x 1 reduced at delivery.  APGAR: 8, 9; weight pending  .   Placenta status: spontaneous and intact.  Cord 3-vessel:  with the following complications: None.    Anesthesia: Epidural  Episiotomy: None Lacerations:  intact Suture Repair: n/a Est. Blood Loss (mL):    Mom to postpartum.  Baby to Couplet care / Skin to Skin.  28 y.o. G1P0000 @[redacted]w[redacted]d  admitted 4 days ago for IOL for postdates. After lengthy induction with Cytotec and foley bulb, pt progressed on Pitocin on 10/31/14 from 4 cm and thick following foley catheter removal to 4cm and 100% effaced, then progressed well to 10 cm within a few hours.  She pushed less than 1 hour to deliver, with [facility]al cord x 1 [facility]  Infant placed in mother's arms immediately after birth.  Cord clamping delayed by several minutes then clamped by CNM and cut by FOB.  Placenta delivered spontaneous and intact.  Infant and mother stable and rooming in.   LEFTWICH-KIRBY, LISA 11/01/2014, 5:22 AM

## 2014-03-02 ENCOUNTER — Ambulatory Visit (INDEPENDENT_AMBULATORY_CARE_PROVIDER_SITE_OTHER): Payer: PRIVATE HEALTH INSURANCE | Admitting: Advanced Practice Midwife

## 2014-03-02 ENCOUNTER — Encounter: Payer: Self-pay | Admitting: Advanced Practice Midwife

## 2014-03-02 VITALS — Ht 62.0 in | Wt 209.0 lb

## 2014-03-02 DIAGNOSIS — N939 Abnormal uterine and vaginal bleeding, unspecified: Secondary | ICD-10-CM

## 2014-03-02 DIAGNOSIS — O26859 Spotting complicating pregnancy, unspecified trimester: Secondary | ICD-10-CM

## 2014-03-02 DIAGNOSIS — O209 Hemorrhage in early pregnancy, unspecified: Secondary | ICD-10-CM | POA: Insufficient documentation

## 2014-03-02 LAB — POCT URINE PREGNANCY: Preg Test, Ur: POSITIVE

## 2014-03-02 NOTE — Progress Notes (Signed)
Subjective:     Kristy Rodgers is a 28 y.o. female here for spotting in early pregnancy.  She has new OB visit scheduled in 2 weeks.  She reports new onset bright red bleeding x1 episode today when wiping that has resolved by today's visit.  She also reports mild cramping all day today.  She denies vaginal itching/burning, urinary symptoms, h/a, dizziness, n/v, or fever/chills.        Obstetric History OB History  Gravida Para Term Preterm AB SAB TAB Ectopic Multiple Living  1 0 0 0 0 0 0 0 0 0     # Outcome Date GA Lbr Len/2nd Weight Sex Delivery Anes PTL Lv  1 CUR                The following portions of the patient's history were reviewed and updated as appropriate: allergies, current medications, past family history, past medical history, past social history, past surgical history and problem list.  Review of Systems A comprehensive review of systems was negative.    Objective:    Ht 5\' 2"  (1.575 m)  Wt 94.802 kg (209 lb)  BMI 38.22 kg/m2  LMP 01/16/2014 Pelvic: cervix normal in appearance, external genitalia normal, no adnexal masses or tenderness, no cervical motion tenderness, rectovaginal septum normal, uterus normal size, shape, and consistency, vagina normal without discharge and no blood noted in vagina during speculum exam or on glove following bimanual exam    Bedside U/S by Mariel AloeLora Clark, RN with IUP at 6 weeks with cardiac activity, no bleeding noted on vaginal probe following sono  Assessment:    Healthy female exam.    Plan:    Pelvic rest x2 weeks and keep scheduled new OB visit in 2 weeks

## 2014-03-03 LAB — OBSTETRIC PANEL
ANTIBODY SCREEN: NEGATIVE
Basophils Absolute: 0 10*3/uL (ref 0.0–0.1)
Basophils Relative: 0 % (ref 0–1)
EOS PCT: 1 % (ref 0–5)
Eosinophils Absolute: 0.1 10*3/uL (ref 0.0–0.7)
HEMATOCRIT: 34.4 % — AB (ref 36.0–46.0)
HEMOGLOBIN: 11.6 g/dL — AB (ref 12.0–15.0)
Hepatitis B Surface Ag: NEGATIVE
LYMPHS PCT: 21 % (ref 12–46)
Lymphs Abs: 1.5 10*3/uL (ref 0.7–4.0)
MCH: 28.9 pg (ref 26.0–34.0)
MCHC: 33.7 g/dL (ref 30.0–36.0)
MCV: 85.8 fL (ref 78.0–100.0)
MONO ABS: 0.6 10*3/uL (ref 0.1–1.0)
MONOS PCT: 8 % (ref 3–12)
NEUTROS ABS: 5 10*3/uL (ref 1.7–7.7)
Neutrophils Relative %: 70 % (ref 43–77)
Platelets: 265 10*3/uL (ref 150–400)
RBC: 4.01 MIL/uL (ref 3.87–5.11)
RDW: 13.6 % (ref 11.5–15.5)
RH TYPE: POSITIVE
RUBELLA: 2.7 {index} — AB (ref <0.90)
WBC: 7.2 10*3/uL (ref 4.0–10.5)

## 2014-03-03 LAB — GC/CHLAMYDIA PROBE AMP
CT PROBE, AMP APTIMA: NEGATIVE
GC Probe RNA: NEGATIVE

## 2014-03-03 LAB — CULTURE, OB URINE
Colony Count: NO GROWTH
ORGANISM ID, BACTERIA: NO GROWTH

## 2014-03-03 LAB — HIV ANTIBODY (ROUTINE TESTING W REFLEX): HIV: NONREACTIVE

## 2014-03-23 ENCOUNTER — Encounter: Payer: Self-pay | Admitting: Advanced Practice Midwife

## 2014-03-23 ENCOUNTER — Ambulatory Visit (INDEPENDENT_AMBULATORY_CARE_PROVIDER_SITE_OTHER): Payer: PRIVATE HEALTH INSURANCE | Admitting: Advanced Practice Midwife

## 2014-03-23 VITALS — BP 101/69 | Wt 211.0 lb

## 2014-03-23 DIAGNOSIS — Z3480 Encounter for supervision of other normal pregnancy, unspecified trimester: Secondary | ICD-10-CM

## 2014-03-23 DIAGNOSIS — Z3490 Encounter for supervision of normal pregnancy, unspecified, unspecified trimester: Secondary | ICD-10-CM | POA: Insufficient documentation

## 2014-03-23 DIAGNOSIS — O99350 Diseases of the nervous system complicating pregnancy, unspecified trimester: Secondary | ICD-10-CM

## 2014-03-23 DIAGNOSIS — G40909 Epilepsy, unspecified, not intractable, without status epilepticus: Secondary | ICD-10-CM

## 2014-03-23 DIAGNOSIS — O209 Hemorrhage in early pregnancy, unspecified: Secondary | ICD-10-CM

## 2014-03-23 NOTE — Progress Notes (Signed)
p-78    Bedside U/S showed GA 9 w and FHT 178   CRL 23.658mm

## 2014-03-23 NOTE — Progress Notes (Signed)
   Subjective:    Kristy Rodgers is a G1P0000 8177w3d being seen today for her first obstetrical visit.  Her obstetrical history is significant for seizure disorder, obesity. Patient does intend to breast feed. Pregnancy history fully reviewed.  Was seen in office three weeks ago for spotting. Live, IUP verified. No further bleeding. Pelvic and NOB labs done. Last Pap 12/14 normal.   Patient reports no complaints and No bleeding since before last visit. Ceasar Mons.  Filed Vitals:   03/23/14 0859  BP: 101/69  Weight: 211 lb (95.709 kg)    HISTORY: OB History  Gravida Para Term Preterm AB SAB TAB Ectopic Multiple Living  1 0 0 0 0 0 0 0 0 0     # Outcome Date GA Lbr Len/2nd Weight Sex Delivery Anes PTL Lv  1 CUR              Past Medical History  Diagnosis Date  . Polycystic ovarian syndrome   . Obesity   . Arthritis   . Reflux   . Anxiety   . Hypothyroid   . Seizures   . Chronic headaches    Past Surgical History  Procedure Laterality Date  . Wrist fracture surgery      age 28  . Wisdom tooth extraction     Family History  Problem Relation Age of Onset  . Cancer Mother     skin  . Fibroids Mother     uterine  . Cancer Paternal Grandmother     breast     Exam    Uterus:     Pelvic Exam: Deferred                              System: Breast:  normal appearance, no masses or tenderness   Skin: normal coloration and turgor, no rashes    Neurologic: oriented, normal, normal mood, gait normal; reflexes normal and symmetric   Extremities: normal strength, tone, and muscle mass   HEENT PERRLA, sclera clear, anicteric and thyroid without masses   Mouth/Teeth mucous membranes moist, pharynx normal without lesions and dental hygiene good   Neck no masses   Cardiovascular: regular rate and rhythm, no murmurs or gallops   Respiratory:  appears well, vitals normal, no respiratory distress, acyanotic, normal RR, chest clear, no wheezing, crepitations, rhonchi, normal  symmetric air entry   Abdomen: soft, non-tender; bowel sounds normal; no masses,  no organomegaly        Assessment:    Pregnancy: G1P0000 Patient Active Problem List   Diagnosis Date Noted  . Seizure disorder in pregnancy, antepartum 03/24/2014  . Supervision of normal pregnancy 03/23/2014  . Vaginal bleeding in pregnant patient at less than [redacted] weeks gestation 03/02/2014     Plan:     Initial labs drawn at last visit. Reviewed. Prenatal vitamins. Problem list reviewed and updated. Genetic Screening discussed Integrated Screen: undecided.  Ultrasound discussed; fetal survey: requested.  Follow up in 4 weeks. 75% of 30 min visit spent on counseling and coordination of care.  Encouraged to continue care w/ neuro and continue Lamictal.    Kristy Rodgers 03/24/2014

## 2014-03-23 NOTE — Patient Instructions (Signed)
Pregnancy - First Trimester During sexual intercourse, millions of sperm go into the vagina. Only 1 sperm will penetrate and fertilize the female egg while it is in the Fallopian tube. One week later, the fertilized egg implants into the wall of the uterus. An embryo begins to develop into a baby. At 6 to 8 weeks, the eyes and face are formed and the heartbeat can be seen on ultrasound. At the end of 12 weeks (first trimester), all the baby's organs are formed. Now that you are pregnant, you will want to do everything you can to have a healthy baby. Two of the most important things are to get good prenatal care and follow your caregiver's instructions. Prenatal care is all the medical care you receive before the baby's birth. It is given to prevent, find, and treat problems during the pregnancy and childbirth. PRENATAL EXAMS  During prenatal visits, your weight, blood pressure, and urine are checked. This is done to make sure you are healthy and progressing normally during the pregnancy.  A pregnant woman should gain 25 to 35 pounds during the pregnancy. However, if you are overweight or underweight, your caregiver will advise you regarding your weight.  Your caregiver will ask and answer questions for you.  Blood work, cervical cultures, other necessary tests, and a Pap test are done during your prenatal exams. These tests are done to check on your health and the probable health of your baby. Tests are strongly recommended and done for HIV with your permission. This is the virus that causes AIDS. These tests are done because medicines can be given to help prevent your baby from being born with this infection should you have been infected without knowing it. Blood work is also used to find out your blood type, previous infections, and follow your blood levels (hemoglobin).  Low hemoglobin (anemia) is common during pregnancy. Iron and vitamins are given to help prevent this. Later in the pregnancy,  blood tests for diabetes will be done along with any other tests if any problems develop.  You may need other tests to make sure you and the baby are doing well. CHANGES DURING THE FIRST TRIMESTER  Your body goes through many changes during pregnancy. They vary from person to person. Talk to your caregiver about changes you notice and are concerned about. Changes can include:  Your menstrual period stops.  The egg and sperm carry the genes that determine what you look like. Genes from you and your partner are forming a baby. The female genes determine whether the baby is a boy or a girl.  Your body increases in girth and you may feel bloated.  Feeling sick to your stomach (nauseous) and throwing up (vomiting). If the vomiting is uncontrollable, call your caregiver.  Your breasts will begin to enlarge and become tender.  Your nipples may stick out more and become darker.  The need to urinate more. Painful urination may mean you have a bladder infection.  Tiring easily.  Loss of appetite.  Cravings for certain kinds of food.  At first, you may gain or lose a couple of pounds.  You may have changes in your emotions from day to day (excited to be pregnant or concerned something may go wrong with the pregnancy and baby).  You may have more vivid and strange dreams. HOME CARE INSTRUCTIONS   It is very important to avoid all smoking, alcohol and non-prescribed drugs during your pregnancy. These affect the formation and growth of the baby.   Avoid chemicals while pregnant to ensure the delivery of a healthy infant.  Start your prenatal visits by the 12th week of pregnancy. They are usually scheduled monthly at first, then more often in the last 2 months before delivery. Keep your caregiver's appointments. Follow your caregiver's instructions regarding medicine use, blood and lab tests, exercise, and diet.  During pregnancy, you are providing food for you and your baby. Eat regular,  well-balanced meals. Choose foods such as meat, fish, milk and other low fat dairy products, vegetables, fruits, and whole-grain breads and cereals. Your caregiver will tell you of the ideal weight gain.  You can help morning sickness by keeping soda crackers at the bedside. Eat a couple before arising in the morning. You may want to use the crackers without salt on them.  Eating 4 to 5 small meals rather than 3 large meals a day also may help the nausea and vomiting.  Drinking liquids between meals instead of during meals also seems to help nausea and vomiting.  A physical sexual relationship may be continued throughout pregnancy if there are no other problems. Problems may be early (premature) leaking of amniotic fluid from the membranes, vaginal bleeding, or belly (abdominal) pain.  Exercise regularly if there are no restrictions. Check with your caregiver or physical therapist if you are unsure of the safety of some of your exercises. Greater weight gain will occur in the last 2 trimesters of pregnancy. Exercising will help:  Control your weight.  Keep you in shape.  Prepare you for labor and delivery.  Help you lose your pregnancy weight after you deliver your baby.  Wear a good support or jogging bra for breast tenderness during pregnancy. This may help if worn during sleep too.  Ask when prenatal classes are available. Begin classes when they are offered.  Do not use hot tubs, steam rooms, or saunas.  Wear your seat belt when driving. This protects you and your baby if you are in an accident.  Avoid raw meat, uncooked cheese, cat litter boxes, and soil used by cats throughout the pregnancy. These carry germs that can cause birth defects in the baby.  The first trimester is a good time to visit your dentist for your dental health. Getting your teeth cleaned is okay. Use a softer toothbrush and brush gently during pregnancy.  Ask for help if you have financial, counseling, or  nutritional needs during pregnancy. Your caregiver will be able to offer counseling for these needs as well as refer you for other special needs.  Do not take any medicines or herbs unless told by your caregiver.  Inform your caregiver if there is any mental or physical domestic violence.  Make a list of emergency phone numbers of family, friends, hospital, and police and fire departments.  Write down your questions. Take them to your prenatal visit.  Do not douche.  Do not cross your legs.  If you have to stand for long periods of time, rotate you feet or take small steps in a circle.  You may have more vaginal secretions that may require a sanitary pad. Do not use tampons or scented sanitary pads. MEDICINES AND DRUG USE IN PREGNANCY  Take prenatal vitamins as directed. The vitamin should contain 1 milligram of folic acid. Keep all vitamins out of reach of children. Only a couple vitamins or tablets containing iron may be fatal to a baby or young child when ingested.  Avoid use of all medicines, including herbs, over-the-counter medicines, not   prescribed or suggested by your caregiver. Only take over-the-counter or prescription medicines for pain, discomfort, or fever as directed by your caregiver. Do not use aspirin, ibuprofen, or naproxen unless directed by your caregiver.  Let your caregiver also know about herbs you may be using.  Alcohol is related to a number of birth defects. This includes fetal alcohol syndrome. All alcohol, in any form, should be avoided completely. Smoking will cause low birth rate and premature babies.  Street or illegal drugs are very harmful to the baby. They are absolutely forbidden. A baby born to an addicted mother will be addicted at birth. The baby will go through the same withdrawal an adult does.  Let your caregiver know about any medicines that you have to take and for what reason you take them. SEEK MEDICAL CARE IF:  You have any concerns or  worries during your pregnancy. It is better to call with your questions if you feel they cannot wait, rather than worry about them. SEEK IMMEDIATE MEDICAL CARE IF:   An unexplained oral temperature above 102 F (38.9 C) develops, or as your caregiver suggests.  You have leaking of fluid from the vagina (birth canal). If leaking membranes are suspected, take your temperature and inform your caregiver of this when you call.  There is vaginal spotting or bleeding. Notify your caregiver of the amount and how many pads are used.  You develop a bad smelling vaginal discharge with a change in the color.  You continue to feel sick to your stomach (nauseated) and have no relief from remedies suggested. You vomit blood or coffee ground-like materials.  You lose more than 2 pounds of weight in 1 week.  You gain more than 2 pounds of weight in 1 week and you notice swelling of your face, hands, feet, or legs.  You gain 5 pounds or more in 1 week (even if you do not have swelling of your hands, face, legs, or feet).  You get exposed to German measles and have never had them.  You are exposed to fifth disease or chickenpox.  You develop belly (abdominal) pain. Round ligament discomfort is a common non-cancerous (benign) cause of abdominal pain in pregnancy. Your caregiver still must evaluate this.  You develop headache, fever, diarrhea, pain with urination, or shortness of breath.  You fall or are in a car accident or have any kind of trauma.  There is mental or physical violence in your home. Document Released: 11/21/2001 Document Revised: 08/21/2012 Document Reviewed: 05/25/2009 [employer] Patient Information 2014 [employer].  Breastfeeding Deciding to breastfeed is one of the best choices you can make for you and your baby. A change in hormones during pregnancy causes your breast tissue to grow and increases the number and size of your milk ducts. These hormones also allow proteins,  sugars, and fats from your blood supply to make breast milk in your milk-producing glands. Hormones prevent breast milk from being released before your baby is born as well as prompt milk flow after birth. Once breastfeeding has begun, thoughts of your baby, as well as his or her sucking or crying, can stimulate the release of milk from your milk-producing glands.  BENEFITS OF BREASTFEEDING For Your Baby  Your first milk (colostrum) helps your baby's digestive system function better.   There are antibodies in your milk that help your baby fight off infections.   Your baby has a lower incidence of asthma, allergies, and sudden infant death syndrome.   The nutrients   in breast milk are better for your baby than infant formulas and are designed uniquely for your baby's needs.   Breast milk improves your baby's brain development.   Your baby is less likely to develop other conditions, such as childhood obesity, asthma, or type 2 diabetes mellitus.  For You   Breastfeeding helps to create a very special bond between you and your baby.   Breastfeeding is convenient. Breast milk is always available at the correct temperature and costs nothing.   Breastfeeding helps to burn calories and helps you lose the weight gained during pregnancy.   Breastfeeding makes your uterus contract to its prepregnancy size faster and slows bleeding (lochia) after you give birth.   Breastfeeding helps to lower your risk of developing type 2 diabetes mellitus, osteoporosis, and breast or ovarian cancer later in life. SIGNS THAT YOUR BABY IS HUNGRY Early Signs of Hunger  Increased alertness or activity.  Stretching.  Movement of the head from side to side.  Movement of the head and opening of the mouth when the corner of the mouth or cheek is stroked (rooting).  Increased sucking sounds, smacking lips, cooing, sighing, or squeaking.  Hand-to-mouth movements.  Increased sucking of fingers or  hands. Late Signs of Hunger  Fussing.  Intermittent crying. Extreme Signs of Hunger Signs of extreme hunger will require calming and consoling before your baby will be able to breastfeed successfully. Do not wait for the following signs of extreme hunger to occur before you initiate breastfeeding:   Restlessness.  A loud, strong cry.   Screaming. BREASTFEEDING BASICS Breastfeeding Initiation  Find a comfortable place to sit or lie down, with your neck and back well supported.  Place a pillow or rolled up blanket under your baby to bring him or her to the level of your breast (if you are seated). Nursing pillows are specially designed to help support your arms and your baby while you breastfeed.  Make sure that your baby's abdomen is facing your abdomen.   Gently massage your breast. With your fingertips, massage from your chest wall toward your nipple in a circular motion. This encourages milk flow. You may need to continue this action during the feeding if your milk flows slowly.  Support your breast with 4 fingers underneath and your thumb above your nipple. Make sure your fingers are well away from your nipple and your baby's mouth.   Stroke your baby's lips gently with your finger or nipple.   When your baby's mouth is open wide enough, quickly bring your baby to your breast, placing your entire nipple and as much of the colored area around your nipple (areola) as possible into your baby's mouth.   More areola should be visible above your baby's upper lip than below the lower lip.   Your baby's tongue should be between his or her lower gum and your breast.   Ensure that your baby's mouth is correctly positioned around your nipple (latched). Your baby's lips should create a seal on your breast and be turned out (everted).  It is common for your baby to suck about 2 3 minutes in order to start the flow of breast milk. Latching Teaching your baby how to latch on to your  breast properly is very important. An improper latch can cause nipple pain and decreased milk supply for you and poor weight gain in your baby. Also, if your baby is not latched onto your nipple properly, he or she may swallow some air during   feeding. This can make your baby fussy. Burping your baby when you switch breasts during the feeding can help to get rid of the air. However, teaching your baby to latch on properly is still the best way to prevent fussiness from swallowing air while breastfeeding. Signs that your baby has successfully latched on to your nipple:    Silent tugging or silent sucking, without causing you pain.   Swallowing heard between every 3 4 sucks.    Muscle movement above and in front of his or her ears while sucking.  Signs that your baby has not successfully latched on to nipple:   Sucking sounds or smacking sounds from your baby while breastfeeding.  Nipple pain. If you think your baby has not latched on correctly, slip your finger into the corner of your baby's mouth to break the suction and place it between your baby's gums. Attempt breastfeeding initiation again. Signs of Successful Breastfeeding Signs from your baby:   A gradual decrease in the number of sucks or complete cessation of sucking.   Falling asleep.   Relaxation of his or her body.   Retention of a small amount of milk in his or her mouth.   Letting go of your breast by himself or herself. Signs from you:  Breasts that have increased in firmness, weight, and size 1 3 hours after feeding.   Breasts that are softer immediately after breastfeeding.  Increased milk volume, as well as a change in milk consistency and color by the 5th day of breastfeeding.   Nipples that are not sore, cracked, or bleeding. Signs That Your Baby is Getting Enough Milk  Wetting at least 3 diapers in a 24-hour period. The urine should be clear and pale yellow by age 5 days.  At least 3 stools in a  24-hour period by age 5 days. The stool should be soft and yellow.  At least 3 stools in a 24-hour period by age 7 days. The stool should be seedy and yellow.  No loss of weight greater than 10% of birth weight during the first 3 days of age.  Average weight gain of 4 7 ounces (120 210 mL) per week after age 4 days.  Consistent daily weight gain by age 5 days, without weight loss after the age of 2 weeks. After a feeding, your baby may spit up a small amount. This is common. BREASTFEEDING FREQUENCY AND DURATION Frequent feeding will help you make more milk and can prevent sore nipples and breast engorgement. Breastfeed when you feel the need to reduce the fullness of your breasts or when your baby shows signs of hunger. This is called "breastfeeding on demand." Avoid introducing a pacifier to your baby while you are working to establish breastfeeding (the first 4 6 weeks after your baby is born). After this time you may choose to use a pacifier. Research has shown that pacifier use during the first year of a baby's life decreases the risk of sudden infant death syndrome (SIDS). Allow your baby to feed on each breast as long as he or she wants. Breastfeed until your baby is finished feeding. When your baby unlatches or falls asleep while feeding from the first breast, offer the second breast. Because newborns are often sleepy in the first few weeks of life, you may need to awaken your baby to get him or her to feed. Breastfeeding times will vary from baby to baby. However, the following rules can serve as a guide to help you   ensure that your baby is properly fed:  Newborns (babies 4 weeks of age or younger) may breastfeed every 1 3 hours.  Newborns should not go longer than 3 hours during the day or 5 hours during the night without breastfeeding.  You should breastfeed your baby a minimum of 8 times in a 24-hour period until you begin to introduce solid foods to your baby at around 6 months of  age. BREAST MILK PUMPING Pumping and storing breast milk allows you to ensure that your baby is exclusively fed your breast milk, even at times when you are unable to breastfeed. This is especially important if you are going back to work while you are still breastfeeding or when you are not able to be present during feedings. Your lactation consultant can give you guidelines on how long it is safe to store breast milk.  A breast pump is a machine that allows you to pump milk from your breast into a sterile bottle. The pumped breast milk can then be stored in a refrigerator or freezer. Some breast pumps are operated by hand, while others use electricity. Ask your lactation consultant which type will work best for you. Breast pumps can be purchased, but some hospitals and breastfeeding support groups lease breast pumps on a monthly basis. A lactation consultant can teach you how to hand express breast milk, if you prefer not to use a pump.  CARING FOR YOUR BREASTS WHILE YOU BREASTFEED Nipples can become dry, cracked, and sore while breastfeeding. The following recommendations can help keep your breasts moisturized and healthy:  Avoid using soap on your nipples.   Wear a supportive bra. Although not required, special nursing bras and tank tops are designed to allow access to your breasts for breastfeeding without taking off your entire bra or top. Avoid wearing underwire style bras or extremely tight bras.  Air dry your nipples for 3 4minutes after each feeding.   Use only cotton bra pads to absorb leaked breast milk. Leaking of breast milk between feedings is normal.   Use lanolin on your nipples after breastfeeding. Lanolin helps to maintain your skin's normal moisture barrier. If you use pure lanolin you do not need to wash it off before feeding your baby again. Pure lanolin is not toxic to your baby. You may also hand express a few drops of breast milk and gently massage that milk into your  nipples and allow the milk to air dry. In the first few weeks after giving birth, some women experience extremely full breasts (engorgement). Engorgement can make your breasts feel heavy, warm, and tender to the touch. Engorgement peaks within 3 5 days after you give birth. The following recommendations can help ease engorgement:  Completely empty your breasts while breastfeeding or pumping. You may want to start by applying warm, moist heat (in the shower or with warm water-soaked hand towels) just before feeding or pumping. This increases circulation and helps the milk flow. If your baby does not completely empty your breasts while breastfeeding, pump any extra milk after he or she is finished.  Wear a snug bra (nursing or regular) or tank top for 1 2 days to signal your body to slightly decrease milk production.  Apply ice packs to your breasts, unless this is too uncomfortable for you.  Make sure that your baby is latched on and positioned properly while breastfeeding. If engorgement persists after 48 hours of following these recommendations, contact your health care provider or a lactation consultant. OVERALL   HEALTH CARE RECOMMENDATIONS WHILE BREASTFEEDING  Eat healthy foods. Alternate between meals and snacks, eating 3 of each per day. Because what you eat affects your breast milk, some of the foods may make your baby more irritable than usual. Avoid eating these foods if you are sure that they are negatively affecting your baby.  Drink milk, fruit juice, and water to satisfy your thirst (about 10 glasses a day).   Rest often, relax, and continue to take your prenatal vitamins to prevent fatigue, stress, and anemia.  Continue breast self-awareness checks.  Avoid chewing and smoking tobacco.  Avoid alcohol and drug use. Some medicines that may be harmful to your baby can pass through breast milk. It is important to ask your health care provider before taking any medicine, including all  over-the-counter and prescription medicine as well as vitamin and herbal supplements. It is possible to become pregnant while breastfeeding. If birth control is desired, ask your health care provider about options that will be safe for your baby. SEEK MEDICAL CARE IF:   You feel like you want to stop breastfeeding or have become frustrated with breastfeeding.  You have painful breasts or nipples.  Your nipples are cracked or bleeding.  Your breasts are red, tender, or warm.  You have a swollen area on either breast.  You have a fever or chills.  You have nausea or vomiting.  You have drainage other than breast milk from your nipples.  Your breasts do not become full before feedings by the 5th day after you give birth.  You feel sad and depressed.  Your baby is too sleepy to eat well.  Your baby is having trouble sleeping.   Your baby is wetting less than 3 diapers in a 24-hour period.  Your baby has less than 3 stools in a 24-hour period.  Your baby's skin or the white part of his or her eyes becomes yellow.   Your baby is not gaining weight by 5 days of age. SEEK IMMEDIATE MEDICAL CARE IF:   Your baby is overly tired (lethargic) and does not want to wake up and feed.  Your baby develops an unexplained fever. Document Released: 11/27/2005 Document Revised: 07/30/2013 Document Reviewed: 05/21/2013 [employer] Patient Information 2014 [employer].  

## 2014-03-24 DIAGNOSIS — G40909 Epilepsy, unspecified, not intractable, without status epilepticus: Secondary | ICD-10-CM | POA: Insufficient documentation

## 2014-03-30 ENCOUNTER — Telehealth: Payer: Self-pay | Admitting: *Deleted

## 2014-03-30 NOTE — Telephone Encounter (Signed)
Pt called in stating she thinks "she has gained too much weight and feels tired more than normal". Pt wants to have thyroid checked at next visit. 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 I adv pt to call back if symptoms worsen or new symptoms come. Pt adv she does not need to come in earlier than next scheduled appt but would like Thyroid checked at next visit.

## 2014-04-20 ENCOUNTER — Ambulatory Visit (INDEPENDENT_AMBULATORY_CARE_PROVIDER_SITE_OTHER): Payer: PRIVATE HEALTH INSURANCE | Admitting: Advanced Practice Midwife

## 2014-04-20 VITALS — BP 116/73 | HR 80 | Wt 210.0 lb

## 2014-04-20 DIAGNOSIS — R197 Diarrhea, unspecified: Secondary | ICD-10-CM

## 2014-04-20 DIAGNOSIS — Z3490 Encounter for supervision of normal pregnancy, unspecified, unspecified trimester: Secondary | ICD-10-CM

## 2014-04-20 DIAGNOSIS — Z3480 Encounter for supervision of other normal pregnancy, unspecified trimester: Secondary | ICD-10-CM

## 2014-04-20 MED ORDER — LOPERAMIDE HCL 2 MG PO TABS: 2.0000 mg | ORAL_TABLET | ORAL | Status: DC

## 2014-04-20 NOTE — Patient Instructions (Signed)
Diarrhea Diarrhea is frequent loose and watery bowel movements. It can cause you to feel weak and dehydrated. Dehydration can cause you to become tired and thirsty, have a dry mouth, and have decreased urination that often is dark yellow. Diarrhea is a sign of another problem, most often an infection that will not last long. In most cases, diarrhea typically lasts 2 3 days. However, it can last longer if it is a sign of something more serious. It is important to treat your diarrhea as directed by your caregive to lessen or prevent future episodes of diarrhea. CAUSES  Some common causes include:  Gastrointestinal infections caused by viruses, bacteria, or parasites.  Food poisoning or food allergies.  Certain medicines, such as antibiotics, chemotherapy, and laxatives.  Artificial sweeteners and fructose.  Digestive disorders. HOME CARE INSTRUCTIONS  Ensure adequate fluid intake (hydration): have 1 cup (8 oz) of fluid for each diarrhea episode. Avoid fluids that contain simple sugars or sports drinks, fruit juices, whole milk products, and sodas. Your urine should be clear or pale yellow if you are drinking enough fluids. Hydrate with an oral rehydration solution that you can purchase at pharmacies, retail stores, and online. You can prepare an oral rehydration solution at home by mixing the following ingredients together:    tsp table salt.   tsp baking soda.   tsp salt substitute containing potassium chloride.  1  tablespoons sugar.  1 L (34 oz) of water.  Certain foods and beverages may increase the speed at which food moves through the gastrointestinal (GI) tract. These foods and beverages should be avoided and include:  Caffeinated and alcoholic beverages.  High-fiber foods, such as raw fruits and vegetables, nuts, seeds, and whole grain breads and cereals.  Foods and beverages sweetened with sugar alcohols, such as xylitol, sorbitol, and mannitol.  Some foods may be well  tolerated and may help thicken stool including:  Starchy foods, such as rice, toast, pasta, low-sugar cereal, oatmeal, grits, baked potatoes, crackers, and bagels.  Bananas.  Applesauce.  Add probiotic-rich foods to help increase healthy bacteria in the GI tract, such as yogurt and fermented milk products.  Wash your hands well after each diarrhea episode.  Only take over-the-counter or prescription medicines as directed by your caregiver.  Take a warm bath to relieve any burning or pain from frequent diarrhea episodes. SEEK IMMEDIATE MEDICAL CARE IF:   You are unable to keep fluids down.  You have persistent vomiting.  You have blood in your stool, or your stools are black and tarry.  You do not urinate in 6 8 hours, or there is only a small amount of very dark urine.  You have abdominal pain that increases or localizes.  You have weakness, dizziness, confusion, or lightheadedness.  You have a severe headache.  Your diarrhea gets worse or does not get better.  You have a fever or persistent symptoms for more than 2 3 days.  You have a fever and your symptoms suddenly get worse. MAKE SURE YOU:   Understand these instructions.  Will watch your condition.  Will get help right away if you are not doing well or get worse. Document Released: 11/17/2002 Document Revised: 11/13/2012 Document Reviewed: 08/04/2012 [facility] Patient Information 2014 [place] [place].  Second Trimester of Pregnancy The second trimester is from week 13 through week 28, months 4 through 6. The second trimester is often a time when you feel your best. Your body has also adjusted to being pregnant, and you begin to  feel better physically. Usually, morning sickness has lessened or quit completely, you may have more energy, and you may have an increase in appetite. The second trimester is also a time when the fetus is growing rapidly. At the end of the sixth month, the fetus is about 9 inches long and  weighs about 1 pounds. You will likely begin to feel the baby move (quickening) between 18 and 20 weeks of the pregnancy. BODY CHANGES Your body goes through many changes during pregnancy. The changes vary from woman to woman.   Your weight will continue to increase. You will notice your lower abdomen bulging out.  You may begin to get stretch marks on your hips, abdomen, and breasts.  You may develop headaches that can be relieved by medicines approved by your caregiver.  You may urinate more often because the fetus is pressing on your bladder.  You may develop or continue to have heartburn as a result of your pregnancy.  You may develop constipation because certain hormones are causing the muscles that push waste through your intestines to slow down.  You may develop hemorrhoids or swollen, bulging veins (varicose veins).  You may have back pain because of the weight gain and pregnancy hormones relaxing your joints between the bones in your pelvis and as a result of a shift in weight and the muscles that support your balance.  Your breasts will continue to grow and be tender.  Your gums may bleed and may be sensitive to brushing and flossing.  Dark spots or blotches (chloasma, mask of pregnancy) may develop on your face. This will likely fade after the baby is born.  A dark line from your belly button to the pubic area (linea nigra) may appear. This will likely fade after the baby is born. WHAT TO EXPECT AT YOUR PRENATAL VISITS During a routine prenatal visit:  You will be weighed to make sure you and the fetus are growing normally.  Your blood pressure will be taken.  Your abdomen will be measured to track your baby's growth.  The fetal heartbeat will be listened to.  Any test results from the previous visit will be discussed. Your caregiver may ask you:  How you are feeling.  If you are feeling the baby move.  If you have had any abnormal symptoms, such as leaking  fluid, bleeding, severe headaches, or abdominal cramping.  If you have any questions. Other tests that may be performed during your second trimester include:  Blood tests that check for:  Low iron levels (anemia).  Gestational diabetes (between 24 and 28 weeks).  Rh antibodies.  Urine tests to check for infections, diabetes, or protein in the urine.  An ultrasound to confirm the proper growth and development of the baby.  An amniocentesis to check for possible genetic problems.  Fetal screens for spina bifida and Down syndrome. HOME CARE INSTRUCTIONS   Avoid all smoking, herbs, alcohol, and unprescribed drugs. These chemicals affect the formation and growth of the baby.  Follow your caregiver's instructions regarding medicine use. There are medicines that are either safe or unsafe to take during pregnancy.  Exercise only as directed by your caregiver. Experiencing uterine cramps is a good sign to stop exercising.  Continue to eat regular, healthy meals.  Wear a good support bra for breast tenderness.  Do not use hot tubs, steam rooms, or saunas.  Wear your seat belt at all times when driving.  Avoid raw meat, uncooked cheese, cat litter boxes, and  soil used by cats. These carry germs that can cause birth defects in the baby.  Take your prenatal vitamins.  Try taking a stool softener (if your caregiver approves) if you develop constipation. Eat more high-fiber foods, such as fresh vegetables or fruit and whole grains. Drink plenty of fluids to keep your urine clear or pale yellow.  Take warm sitz baths to soothe any pain or discomfort caused by hemorrhoids. Use hemorrhoid cream if your caregiver approves.  If you develop varicose veins, wear support hose. Elevate your feet for 15 minutes, 3 4 times a day. Limit salt in your diet.  Avoid heavy lifting, wear low heel shoes, and practice good posture.  Rest with your legs elevated if you have leg cramps or low back  pain.  Visit your dentist if you have not gone yet during your pregnancy. Use a soft toothbrush to brush your teeth and be gentle when you floss.  A sexual relationship may be continued unless your caregiver directs you otherwise.  Continue to go to all your prenatal visits as directed by your caregiver. SEEK MEDICAL CARE IF:   You have dizziness.  You have mild pelvic cramps, pelvic pressure, or nagging pain in the abdominal area.  You have persistent nausea, vomiting, or diarrhea.  You have a bad smelling vaginal discharge.  You have pain with urination. SEEK IMMEDIATE MEDICAL CARE IF:   You have a fever.  You are leaking fluid from your vagina.  You have spotting or bleeding from your vagina.  You have severe abdominal cramping or pain.  You have rapid weight gain or loss.  You have shortness of breath with chest pain.  You notice sudden or extreme swelling of your face, hands, ankles, feet, or legs.  You have not felt your baby move in over an hour.  You have severe headaches that do not go away with medicine.  You have vision changes. Document Released: 11/21/2001 Document Revised: 07/30/2013 Document Reviewed: 01/28/2013 [facility] Patient Information 2014 [place], [place].

## 2014-04-20 NOTE — Progress Notes (Signed)
Watery diarrhea 3-4 x per day x 1 week. Fever 6 days ago. None since. Mild N/V similar to rest of pregnancy N/V. No sick contacts, medication or doetary changes. Stool specimen containers given. Instructed to bring back to lab downstairs. Imodium PRN.

## 2014-04-23 NOTE — Addendum Note (Signed)
Addended by: Granville LewisLARK, Avanthika Dehnert L on: 04/23/2014 09:59 AM   Modules accepted: Orders

## 2014-04-25 LAB — CLOSTRIDIUM DIFFICILE BY PCR: Toxigenic C. Difficile by PCR: NOT DETECTED

## 2014-04-29 LAB — STOOL CULTURE

## 2014-04-30 ENCOUNTER — Telehealth: Payer: Self-pay | Admitting: *Deleted

## 2014-04-30 DIAGNOSIS — A045 Campylobacter enteritis: Secondary | ICD-10-CM

## 2014-04-30 MED ORDER — AZITHROMYCIN 250 MG PO TABS: ORAL_TABLET | ORAL | Status: DC

## 2014-04-30 NOTE — Telephone Encounter (Signed)
Pt is positive for campylobacter, spoke with Dr Penne LashLeggett who ordered Azithromycin 500 mg x 3 days.

## 2014-05-21 ENCOUNTER — Ambulatory Visit (INDEPENDENT_AMBULATORY_CARE_PROVIDER_SITE_OTHER): Payer: PRIVATE HEALTH INSURANCE | Admitting: Obstetrics & Gynecology

## 2014-05-21 ENCOUNTER — Encounter: Payer: Self-pay | Admitting: Obstetrics & Gynecology

## 2014-05-21 ENCOUNTER — Encounter: Payer: Self-pay | Admitting: *Deleted

## 2014-05-21 VITALS — BP 116/77 | HR 95 | Wt 219.0 lb

## 2014-05-21 DIAGNOSIS — Z3480 Encounter for supervision of other normal pregnancy, unspecified trimester: Secondary | ICD-10-CM

## 2014-05-21 DIAGNOSIS — Z3490 Encounter for supervision of normal pregnancy, unspecified, unspecified trimester: Secondary | ICD-10-CM

## 2014-05-21 NOTE — Progress Notes (Signed)
Routine visit. No problems. We discussed recommended weight gain of 20# or less and the risks with excess weight gain (stillbirth, GDM, HTN, pre eclampsia, C/S). She will get an early glucola in the next few weeks. Anatomy u/s to be done at 20 weeks. She declines all genetic testing and MSAFP.

## 2014-06-04 ENCOUNTER — Other Ambulatory Visit (INDEPENDENT_AMBULATORY_CARE_PROVIDER_SITE_OTHER): Payer: PRIVATE HEALTH INSURANCE | Admitting: *Deleted

## 2014-06-04 DIAGNOSIS — E669 Obesity, unspecified: Secondary | ICD-10-CM

## 2014-06-04 DIAGNOSIS — O99210 Obesity complicating pregnancy, unspecified trimester: Secondary | ICD-10-CM

## 2014-06-04 DIAGNOSIS — Z131 Encounter for screening for diabetes mellitus: Secondary | ICD-10-CM

## 2014-06-05 ENCOUNTER — Telehealth: Payer: Self-pay | Admitting: *Deleted

## 2014-06-05 LAB — GLUCOSE TOLERANCE, 1 HOUR (50G) W/O FASTING: Glucose, 1 Hour GTT: 82 mg/dL (ref 70–140)

## 2014-06-05 NOTE — Telephone Encounter (Signed)
LM of normal early 1 hr GTT.

## 2014-06-07 ENCOUNTER — Encounter: Payer: Self-pay | Admitting: Obstetrics & Gynecology

## 2014-06-11 ENCOUNTER — Other Ambulatory Visit: Payer: Self-pay | Admitting: Obstetrics & Gynecology

## 2014-06-11 ENCOUNTER — Ambulatory Visit (HOSPITAL_COMMUNITY)
Admission: RE | Admit: 2014-06-11 | Discharge: 2014-06-11 | Disposition: A | Discharge status: Home / Self Care | Payer: PRIVATE HEALTH INSURANCE | Source: Ambulatory Visit | Attending: Obstetrics & Gynecology | Admitting: Obstetrics & Gynecology

## 2014-06-11 DIAGNOSIS — Z3490 Encounter for supervision of normal pregnancy, unspecified, unspecified trimester: Secondary | ICD-10-CM

## 2014-06-11 DIAGNOSIS — Z3689 Encounter for other specified antenatal screening: Secondary | ICD-10-CM | POA: Insufficient documentation

## 2014-06-22 ENCOUNTER — Ambulatory Visit (INDEPENDENT_AMBULATORY_CARE_PROVIDER_SITE_OTHER): Payer: PRIVATE HEALTH INSURANCE | Admitting: Obstetrics & Gynecology

## 2014-06-22 VITALS — BP 114/75 | HR 82 | Wt 225.0 lb

## 2014-06-22 DIAGNOSIS — Z3492 Encounter for supervision of normal pregnancy, unspecified, second trimester: Secondary | ICD-10-CM

## 2014-06-22 DIAGNOSIS — Z3480 Encounter for supervision of other normal pregnancy, unspecified trimester: Secondary | ICD-10-CM

## 2014-06-22 NOTE — Progress Notes (Signed)
Needs follow up anatomy for heart and spine.  No issues.  Glucola and labs next visit.  Reviewed weight gain.  Pt states she has had a history of thyroid problems.  Will check TSH

## 2014-06-23 ENCOUNTER — Telehealth: Payer: Self-pay | Admitting: *Deleted

## 2014-06-23 LAB — TSH: TSH: 1.033 u[IU]/mL (ref 0.350–4.500)

## 2014-06-23 NOTE — Telephone Encounter (Signed)
LM on voicemail of normal TSH. 

## 2014-06-29 ENCOUNTER — Encounter: Payer: Self-pay | Admitting: Obstetrics & Gynecology

## 2014-07-29 ENCOUNTER — Ambulatory Visit (INDEPENDENT_AMBULATORY_CARE_PROVIDER_SITE_OTHER): Payer: 59 | Admitting: Obstetrics & Gynecology

## 2014-07-29 ENCOUNTER — Ambulatory Visit (HOSPITAL_COMMUNITY)
Admission: RE | Admit: 2014-07-29 | Discharge: 2014-07-29 | Disposition: A | Discharge status: Home / Self Care | Payer: 59 | Source: Ambulatory Visit | Attending: Obstetrics & Gynecology | Admitting: Obstetrics & Gynecology

## 2014-07-29 ENCOUNTER — Encounter: Payer: Self-pay | Admitting: Obstetrics & Gynecology

## 2014-07-29 VITALS — BP 96/70 | HR 84 | Wt 232.0 lb

## 2014-07-29 DIAGNOSIS — Z3400 Encounter for supervision of normal first pregnancy, unspecified trimester: Secondary | ICD-10-CM

## 2014-07-29 DIAGNOSIS — Z3689 Encounter for other specified antenatal screening: Secondary | ICD-10-CM

## 2014-07-29 DIAGNOSIS — G40909 Epilepsy, unspecified, not intractable, without status epilepticus: Secondary | ICD-10-CM

## 2014-07-29 DIAGNOSIS — Z3402 Encounter for supervision of normal first pregnancy, second trimester: Secondary | ICD-10-CM

## 2014-07-29 DIAGNOSIS — Z23 Encounter for immunization: Secondary | ICD-10-CM

## 2014-07-29 DIAGNOSIS — Z3490 Encounter for supervision of normal pregnancy, unspecified, unspecified trimester: Secondary | ICD-10-CM

## 2014-07-29 DIAGNOSIS — O99352 Diseases of the nervous system complicating pregnancy, second trimester: Secondary | ICD-10-CM

## 2014-07-29 DIAGNOSIS — Z3492 Encounter for supervision of normal pregnancy, unspecified, second trimester: Secondary | ICD-10-CM

## 2014-07-29 NOTE — Progress Notes (Signed)
Routine visit. +FM. We discussed fetal sleep cycles and that she should still feel the baby move on a regular basis. [facility] She denies any problems. Glucola, tdap, and labs today. She has a follow up u/s today.

## 2014-07-30 ENCOUNTER — Other Ambulatory Visit: Payer: Self-pay | Admitting: Obstetrics & Gynecology

## 2014-07-30 DIAGNOSIS — O0992 Supervision of high risk pregnancy, unspecified, second trimester: Secondary | ICD-10-CM

## 2014-07-30 LAB — CBC
HCT: 32.6 % — ABNORMAL LOW (ref 36.0–46.0)
Hemoglobin: 10.9 g/dL — ABNORMAL LOW (ref 12.0–15.0)
MCH: 29.9 pg (ref 26.0–34.0)
MCHC: 33.4 g/dL (ref 30.0–36.0)
MCV: 89.6 fL (ref 78.0–100.0)
Platelets: 222 K/uL (ref 150–400)
RBC: 3.64 MIL/uL — ABNORMAL LOW (ref 3.87–5.11)
RDW: 13.6 % (ref 11.5–15.5)
WBC: 9.9 K/uL (ref 4.0–10.5)

## 2014-07-30 LAB — RPR

## 2014-07-30 LAB — GLUCOSE TOLERANCE, 1 HOUR (50G) W/O FASTING: Glucose, 1 Hour GTT: 115 mg/dL (ref 70–140)

## 2014-07-30 LAB — HIV ANTIBODY (ROUTINE TESTING W REFLEX): HIV 1&2 Ab, 4th Generation: NONREACTIVE

## 2014-08-01 ENCOUNTER — Encounter: Payer: Self-pay | Admitting: Obstetrics & Gynecology

## 2014-08-03 ENCOUNTER — Other Ambulatory Visit (INDEPENDENT_AMBULATORY_CARE_PROVIDER_SITE_OTHER): Payer: 59

## 2014-08-03 VITALS — Temp 97.7°F

## 2014-08-03 DIAGNOSIS — N898 Other specified noninflammatory disorders of vagina: Secondary | ICD-10-CM

## 2014-08-03 LAB — POCT URINALYSIS DIPSTICK
Bilirubin, UA: NEGATIVE
Glucose, UA: NEGATIVE
KETONES UA: NEGATIVE
Nitrite, UA: NEGATIVE
Protein, UA: NEGATIVE
Spec Grav, UA: 1.015
UROBILINOGEN UA: NEGATIVE
pH, UA: 7

## 2014-08-04 LAB — WET PREP, GENITAL
CLUE CELLS WET PREP: NONE SEEN
Trich, Wet Prep: NONE SEEN

## 2014-08-05 ENCOUNTER — Telehealth: Payer: Self-pay | Admitting: *Deleted

## 2014-08-05 DIAGNOSIS — B3731 Acute candidiasis of vulva and vagina: Secondary | ICD-10-CM

## 2014-08-05 DIAGNOSIS — B373 Candidiasis of vulva and vagina: Secondary | ICD-10-CM

## 2014-08-05 LAB — URINE CULTURE
Colony Count: NO GROWTH
Organism ID, Bacteria: NO GROWTH

## 2014-08-05 MED ORDER — FLUCONAZOLE 150 MG PO TABS: 150.0000 mg | ORAL_TABLET | Freq: Once | ORAL | Status: DC

## 2014-08-05 NOTE — Telephone Encounter (Signed)
Pt notified of yeast on wet prep.  She does wish to use a Diflucan.  This was sent to [facility].

## 2014-08-14 ENCOUNTER — Ambulatory Visit (INDEPENDENT_AMBULATORY_CARE_PROVIDER_SITE_OTHER): Payer: 59 | Admitting: Advanced Practice Midwife

## 2014-08-14 VITALS — BP 105/65 | HR 85 | Wt 233.0 lb

## 2014-08-14 DIAGNOSIS — O358XX0 Maternal care for other (suspected) fetal abnormality and damage, not applicable or unspecified: Secondary | ICD-10-CM

## 2014-08-14 DIAGNOSIS — O09891 Supervision of other high risk pregnancies, first trimester: Secondary | ICD-10-CM

## 2014-08-14 DIAGNOSIS — Z3493 Encounter for supervision of normal pregnancy, unspecified, third trimester: Secondary | ICD-10-CM

## 2014-08-14 DIAGNOSIS — O35BXX1 Maternal care for other (suspected) fetal abnormality and damage, fetal cardiac anomalies, fetus 1: Secondary | ICD-10-CM

## 2014-08-14 DIAGNOSIS — O358XX1 Maternal care for other (suspected) fetal abnormality and damage, fetus 1: Secondary | ICD-10-CM

## 2014-08-14 DIAGNOSIS — Z3480 Encounter for supervision of other normal pregnancy, unspecified trimester: Secondary | ICD-10-CM

## 2014-08-14 NOTE — Patient Instructions (Signed)
Third Trimester of Pregnancy The third trimester is from week 29 through week 42, months 7 through 9. The third trimester is a time when the fetus is growing rapidly. At the end of the ninth month, the fetus is about 20 inches in length and weighs 6-10 pounds.  BODY CHANGES Your body goes through many changes during pregnancy. The changes vary from woman to woman.   Your weight will continue to increase. You can expect to gain 25-35 pounds (11-16 kg) by the end of the pregnancy.  You may begin to get stretch marks on your hips, abdomen, and breasts.  You may urinate more often because the fetus is moving lower into your pelvis and pressing on your bladder.  You may develop or continue to have heartburn as a result of your pregnancy.  You may develop constipation because certain hormones are causing the muscles that push waste through your intestines to slow down.  You may develop hemorrhoids or swollen, bulging veins (varicose veins).  You may have pelvic pain because of the weight gain and pregnancy hormones relaxing your joints between the bones in your pelvis. Backaches may result from overexertion of the muscles supporting your posture.  You may have changes in your hair. These can include thickening of your hair, rapid growth, and changes in texture. Some women also have hair loss during or after pregnancy, or hair that feels dry or thin. Your hair will most likely return to normal after your baby is born.  Your breasts will continue to grow and be tender. A yellow discharge may leak from your breasts called colostrum.  Your belly button may stick out.  You may feel short of breath because of your expanding uterus.  You may notice the fetus "dropping," or moving lower in your abdomen.  You may have a bloody mucus discharge. This usually occurs a few days to a week before labor begins.  Your cervix becomes thin and soft (effaced) near your due date. WHAT TO EXPECT AT YOUR  PRENATAL EXAMS  You will have prenatal exams every 2 weeks until week 36. Then, you will have weekly prenatal exams. During a routine prenatal visit:  You will be weighed to make sure you and the fetus are growing normally.  Your blood pressure is taken.  Your abdomen will be measured to track your baby's growth.  The fetal heartbeat will be listened to.  Any test results from the previous visit will be discussed.  You may have a cervical check near your due date to see if you have effaced. At around 36 weeks, your caregiver will check your cervix. At the same time, your caregiver will also perform a test on the secretions of the vaginal tissue. This test is to determine if a type of bacteria, Group B streptococcus, is present. Your caregiver will explain this further. Your caregiver may ask you:  What your birth plan is.  How you are feeling.  If you are feeling the baby move.  If you have had any abnormal symptoms, such as leaking fluid, bleeding, severe headaches, or abdominal cramping.  If you have any questions. Other tests or screenings that may be performed during your third trimester include:  Blood tests that check for low iron levels (anemia).  Fetal testing to check the health, activity level, and growth of the fetus. Testing is done if you have certain medical conditions or if there are problems during the pregnancy. FALSE LABOR You may feel small, irregular contractions that   eventually go away. These are called Braxton Hicks contractions, or false labor. Contractions may last for hours, days, or even weeks before true labor sets in. If contractions come at regular intervals, intensify, or become painful, it is best to be seen by your caregiver.  SIGNS OF LABOR   Menstrual-like cramps.  Contractions that are 5 minutes apart or less.  Contractions that start on the top of the uterus and spread down to the lower abdomen and back.  A sense of increased pelvic  pressure or back pain.  A watery or bloody mucus discharge that comes from the vagina. If you have any of these signs before the 37th week of pregnancy, call your caregiver right away. You need to go to the hospital to get checked immediately. HOME CARE INSTRUCTIONS   Avoid all smoking, herbs, alcohol, and unprescribed drugs. These chemicals affect the formation and growth of the baby.  Follow your caregiver's instructions regarding medicine use. There are medicines that are either safe or unsafe to take during pregnancy.  Exercise only as directed by your caregiver. Experiencing uterine cramps is a good sign to stop exercising.  Continue to eat regular, healthy meals.  Wear a good support bra for breast tenderness.  Do not use hot tubs, steam rooms, or saunas.  Wear your seat belt at all times when driving.  Avoid raw meat, uncooked cheese, cat litter boxes, and soil used by cats. These carry germs that can cause birth defects in the baby.  Take your prenatal vitamins.  Try taking a stool softener (if your caregiver approves) if you develop constipation. Eat more high-fiber foods, such as fresh vegetables or fruit and whole grains. Drink plenty of fluids to keep your urine clear or pale yellow.  Take warm sitz baths to soothe any pain or discomfort caused by hemorrhoids. Use hemorrhoid cream if your caregiver approves.  If you develop varicose veins, wear support hose. Elevate your feet for 15 minutes, 3-4 times a day. Limit salt in your diet.  Avoid heavy lifting, wear low heal shoes, and practice good posture.  Rest a lot with your legs elevated if you have leg cramps or low back pain.  Visit your dentist if you have not gone during your pregnancy. Use a soft toothbrush to brush your teeth and be gentle when you floss.  A sexual relationship may be continued unless your caregiver directs you otherwise.  Do not travel far distances unless it is absolutely necessary and only  with the approval of your caregiver.  Take prenatal classes to understand, practice, and ask questions about the labor and delivery.  Make a trial run to the hospital.  Pack your hospital bag.  Prepare the baby's nursery.  Continue to go to all your prenatal visits as directed by your caregiver. SEEK MEDICAL CARE IF:  You are unsure if you are in labor or if your water has broken.  You have dizziness.  You have mild pelvic cramps, pelvic pressure, or nagging pain in your abdominal area.  You have persistent nausea, vomiting, or diarrhea.  You have a bad smelling vaginal discharge.  You have pain with urination. SEEK IMMEDIATE MEDICAL CARE IF:   You have a fever.  You are leaking fluid from your vagina.  You have spotting or bleeding from your vagina.  You have severe abdominal cramping or pain.  You have rapid weight loss or gain.  You have shortness of breath with chest pain.  You notice sudden or extreme swelling   of your face, hands, ankles, feet, or legs.  You have not felt your baby move in over an hour.  You have severe headaches that do not go away with medicine.  You have vision changes. Document Released: 11/21/2001 Document Revised: 12/02/2013 Document Reviewed: 01/28/2013 [employer] Patient Information 2015 [employer]. This information is not intended to replace advice given to you by your health care provider. Make sure you discuss any questions you have with your health care provider.  Breastfeeding Deciding to breastfeed is one of the best choices you can make for you and your baby. A change in hormones during pregnancy causes your breast tissue to grow and increases the number and size of your milk ducts. These hormones also allow proteins, sugars, and fats from your blood supply to make breast milk in your milk-producing glands. Hormones prevent breast milk from being released before your baby is born as well as prompt milk flow after birth. Once  breastfeeding has begun, thoughts of your baby, as well as his or her sucking or crying, can stimulate the release of milk from your milk-producing glands.  BENEFITS OF BREASTFEEDING For Your Baby  Your first milk (colostrum) helps your baby's digestive system function better.   There are antibodies in your milk that help your baby fight off infections.   Your baby has a lower incidence of asthma, allergies, and sudden infant death syndrome.   The nutrients in breast milk are better for your baby than infant formulas and are designed uniquely for your baby's needs.   Breast milk improves your baby's brain development.   Your baby is less likely to develop other conditions, such as childhood obesity, asthma, or type 2 diabetes mellitus.  For You   Breastfeeding helps to create a very special bond between you and your baby.   Breastfeeding is convenient. Breast milk is always available at the correct temperature and costs nothing.   Breastfeeding helps to burn calories and helps you lose the weight gained during pregnancy.   Breastfeeding makes your uterus contract to its prepregnancy size faster and slows bleeding (lochia) after you give birth.   Breastfeeding helps to lower your risk of developing type 2 diabetes mellitus, osteoporosis, and breast or ovarian cancer later in life. SIGNS THAT YOUR BABY IS HUNGRY Early Signs of Hunger  Increased alertness or activity.  Stretching.  Movement of the head from side to side.  Movement of the head and opening of the mouth when the corner of the mouth or cheek is stroked (rooting).  Increased sucking sounds, smacking lips, cooing, sighing, or squeaking.  Hand-to-mouth movements.  Increased sucking of fingers or hands. Late Signs of Hunger  Fussing.  Intermittent crying. Extreme Signs of Hunger Signs of extreme hunger will require calming and consoling before your baby will be able to breastfeed successfully. Do not  wait for the following signs of extreme hunger to occur before you initiate breastfeeding:   Restlessness.  A loud, strong cry.   Screaming. BREASTFEEDING BASICS Breastfeeding Initiation  Find a comfortable place to sit or lie down, with your neck and back well supported.  Place a pillow or rolled up blanket under your baby to bring him or her to the level of your breast (if you are seated). Nursing pillows are specially designed to help support your arms and your baby while you breastfeed.  Make sure that your baby's abdomen is facing your abdomen.   Gently massage your breast. With your fingertips, massage from your chest   wall toward your nipple in a circular motion. This encourages milk flow. You may need to continue this action during the feeding if your milk flows slowly.  Support your breast with 4 fingers underneath and your thumb above your nipple. Make sure your fingers are well away from your nipple and your baby's mouth.   Stroke your baby's lips gently with your finger or nipple.   When your baby's mouth is open wide enough, quickly bring your baby to your breast, placing your entire nipple and as much of the colored area around your nipple (areola) as possible into your baby's mouth.   More areola should be visible above your baby's upper lip than below the lower lip.   Your baby's tongue should be between his or her lower gum and your breast.   Ensure that your baby's mouth is correctly positioned around your nipple (latched). Your baby's lips should create a seal on your breast and be turned out (everted).  It is common for your baby to suck about 2-3 minutes in order to start the flow of breast milk. Latching Teaching your baby how to latch on to your breast properly is very important. An improper latch can cause nipple pain and decreased milk supply for you and poor weight gain in your baby. Also, if your baby is not latched onto your nipple properly, he or she  may swallow some air during feeding. This can make your baby fussy. Burping your baby when you switch breasts during the feeding can help to get rid of the air. However, teaching your baby to latch on properly is still the best way to prevent fussiness from swallowing air while breastfeeding. Signs that your baby has successfully latched on to your nipple:    Silent tugging or silent sucking, without causing you pain.   Swallowing heard between every 3-4 sucks.    Muscle movement above and in front of his or her ears while sucking.  Signs that your baby has not successfully latched on to nipple:   Sucking sounds or smacking sounds from your baby while breastfeeding.  Nipple pain. If you think your baby has not latched on correctly, slip your finger into the corner of your baby's mouth to break the suction and place it between your baby's gums. Attempt breastfeeding initiation again. Signs of Successful Breastfeeding Signs from your baby:   A gradual decrease in the number of sucks or complete cessation of sucking.   Falling asleep.   Relaxation of his or her body.   Retention of a small amount of milk in his or her mouth.   Letting go of your breast by himself or herself. Signs from you:  Breasts that have increased in firmness, weight, and size 1-3 hours after feeding.   Breasts that are softer immediately after breastfeeding.  Increased milk volume, as well as a change in milk consistency and color by the fifth day of breastfeeding.   Nipples that are not sore, cracked, or bleeding. Signs That Your Baby is Getting Enough Milk  Wetting at least 3 diapers in a 24-hour period. The urine should be clear and pale yellow by age 5 days.  At least 3 stools in a 24-hour period by age 5 days. The stool should be soft and yellow.  At least 3 stools in a 24-hour period by age 7 days. The stool should be seedy and yellow.  No loss of weight greater than 10% of birth weight  during the first 3   days of age.  Average weight gain of 4-7 ounces (113-198 g) per week after age 4 days.  Consistent daily weight gain by age 5 days, without weight loss after the age of 2 weeks. After a feeding, your baby may spit up a small amount. This is common. BREASTFEEDING FREQUENCY AND DURATION Frequent feeding will help you make more milk and can prevent sore nipples and breast engorgement. Breastfeed when you feel the need to reduce the fullness of your breasts or when your baby shows signs of hunger. This is called "breastfeeding on demand." Avoid introducing a pacifier to your baby while you are working to establish breastfeeding (the first 4-6 weeks after your baby is born). After this time you may choose to use a pacifier. Research has shown that pacifier use during the first year of a baby's life decreases the risk of sudden infant death syndrome (SIDS). Allow your baby to feed on each breast as long as he or she wants. Breastfeed until your baby is finished feeding. When your baby unlatches or falls asleep while feeding from the first breast, offer the second breast. Because newborns are often sleepy in the first few weeks of life, you may need to awaken your baby to get him or her to feed. Breastfeeding times will vary from baby to baby. However, the following rules can serve as a guide to help you ensure that your baby is properly fed:  Newborns (babies 4 weeks of age or younger) may breastfeed every 1-3 hours.  Newborns should not go longer than 3 hours during the day or 5 hours during the night without breastfeeding.  You should breastfeed your baby a minimum of 8 times in a 24-hour period until you begin to introduce solid foods to your baby at around 6 months of age. BREAST MILK PUMPING Pumping and storing breast milk allows you to ensure that your baby is exclusively fed your breast milk, even at times when you are unable to breastfeed. This is especially important if you are  going back to work while you are still breastfeeding or when you are not able to be present during feedings. Your lactation consultant can give you guidelines on how long it is safe to store breast milk.  A breast pump is a machine that allows you to pump milk from your breast into a sterile bottle. The pumped breast milk can then be stored in a refrigerator or freezer. Some breast pumps are operated by hand, while others use electricity. Ask your lactation consultant which type will work best for you. Breast pumps can be purchased, but some hospitals and breastfeeding support groups lease breast pumps on a monthly basis. A lactation consultant can teach you how to hand express breast milk, if you prefer not to use a pump.  CARING FOR YOUR BREASTS WHILE YOU BREASTFEED Nipples can become dry, cracked, and sore while breastfeeding. The following recommendations can help keep your breasts moisturized and healthy:  Avoid using soap on your nipples.   Wear a supportive bra. Although not required, special nursing bras and tank tops are designed to allow access to your breasts for breastfeeding without taking off your entire bra or top. Avoid wearing underwire-style bras or extremely tight bras.  Air dry your nipples for 3-4minutes after each feeding.   Use only cotton bra pads to absorb leaked breast milk. Leaking of breast milk between feedings is normal.   Use lanolin on your nipples after breastfeeding. Lanolin helps to maintain your skin's   normal moisture barrier. If you use pure lanolin, you do not need to wash it off before feeding your baby again. Pure lanolin is not toxic to your baby. You may also hand express a few drops of breast milk and gently massage that milk into your nipples and allow the milk to air dry. In the first few weeks after giving birth, some women experience extremely full breasts (engorgement). Engorgement can make your breasts feel heavy, warm, and tender to the touch.  Engorgement peaks within 3-5 days after you give birth. The following recommendations can help ease engorgement:  Completely empty your breasts while breastfeeding or pumping. You may want to start by applying warm, moist heat (in the shower or with warm water-soaked hand towels) just before feeding or pumping. This increases circulation and helps the milk flow. If your baby does not completely empty your breasts while breastfeeding, pump any extra milk after he or she is finished.  Wear a snug bra (nursing or regular) or tank top for 1-2 days to signal your body to slightly decrease milk production.  Apply ice packs to your breasts, unless this is too uncomfortable for you.  Make sure that your baby is latched on and positioned properly while breastfeeding. If engorgement persists after 48 hours of following these recommendations, contact your health care provider or a lactation consultant. OVERALL HEALTH CARE RECOMMENDATIONS WHILE BREASTFEEDING  Eat healthy foods. Alternate between meals and snacks, eating 3 of each per day. Because what you eat affects your breast milk, some of the foods may make your baby more irritable than usual. Avoid eating these foods if you are sure that they are negatively affecting your baby.  Drink milk, fruit juice, and water to satisfy your thirst (about 10 glasses a day).   Rest often, relax, and continue to take your prenatal vitamins to prevent fatigue, stress, and anemia.  Continue breast self-awareness checks.  Avoid chewing and smoking tobacco.  Avoid alcohol and drug use. Some medicines that may be harmful to your baby can pass through breast milk. It is important to ask your health care provider before taking any medicine, including all over-the-counter and prescription medicine as well as vitamin and herbal supplements. It is possible to become pregnant while breastfeeding. If birth control is desired, ask your health care provider about options that  will be safe for your baby. SEEK MEDICAL CARE IF:   You feel like you want to stop breastfeeding or have become frustrated with breastfeeding.  You have painful breasts or nipples.  Your nipples are cracked or bleeding.  Your breasts are red, tender, or warm.  You have a swollen area on either breast.  You have a fever or chills.  You have nausea or vomiting.  You have drainage other than breast milk from your nipples.  Your breasts do not become full before feedings by the fifth day after you give birth.  You feel sad and depressed.  Your baby is too sleepy to eat well.  Your baby is having trouble sleeping.   Your baby is wetting less than 3 diapers in a 24-hour period.  Your baby has less than 3 stools in a 24-hour period.  Your baby's skin or the white part of his or her eyes becomes yellow.   Your baby is not gaining weight by 5 days of age. SEEK IMMEDIATE MEDICAL CARE IF:   Your baby is overly tired (lethargic) and does not want to wake up and feed.  Your baby   develops an unexplained fever. Document Released: 11/27/2005 Document Revised: 12/02/2013 Document Reviewed: 05/21/2013 [employer] Patient Information 2015 [employer]. This information is not intended to replace advice given to you by your health care provider. Make sure you discuss any questions you have with your health care provider.  

## 2014-08-14 NOTE — Progress Notes (Signed)
Reviewed [place], LVEIF, Incomplete anatomy. F/U US scheduled. Lamictal L2 (probably safe)

## 2014-08-27 ENCOUNTER — Encounter (HOSPITAL_COMMUNITY): Payer: Self-pay

## 2014-08-27 ENCOUNTER — Ambulatory Visit (HOSPITAL_COMMUNITY)
Admission: RE | Admit: 2014-08-27 | Discharge: 2014-08-27 | Disposition: A | Discharge status: Home / Self Care | Payer: 59 | Source: Ambulatory Visit | Attending: Obstetrics & Gynecology | Admitting: Obstetrics & Gynecology

## 2014-08-27 ENCOUNTER — Other Ambulatory Visit: Payer: Self-pay | Admitting: Obstetrics & Gynecology

## 2014-08-27 VITALS — BP 112/66 | HR 82 | Wt 237.0 lb

## 2014-08-27 DIAGNOSIS — O09899 Supervision of other high risk pregnancies, unspecified trimester: Secondary | ICD-10-CM | POA: Diagnosis not present

## 2014-08-27 DIAGNOSIS — O365990 Maternal care for other known or suspected poor fetal growth, unspecified trimester, not applicable or unspecified: Secondary | ICD-10-CM | POA: Diagnosis not present

## 2014-08-27 DIAGNOSIS — O0992 Supervision of high risk pregnancy, unspecified, second trimester: Secondary | ICD-10-CM

## 2014-08-27 DIAGNOSIS — O365931 Maternal care for other known or suspected poor fetal growth, third trimester, fetus 1: Secondary | ICD-10-CM

## 2014-08-28 ENCOUNTER — Encounter: Payer: Self-pay | Admitting: Obstetrics and Gynecology

## 2014-08-28 ENCOUNTER — Ambulatory Visit (INDEPENDENT_AMBULATORY_CARE_PROVIDER_SITE_OTHER): Payer: 59 | Admitting: Obstetrics and Gynecology

## 2014-08-28 VITALS — BP 108/63 | HR 102 | Wt 238.0 lb

## 2014-08-28 DIAGNOSIS — Z3400 Encounter for supervision of normal first pregnancy, unspecified trimester: Secondary | ICD-10-CM

## 2014-08-28 DIAGNOSIS — E669 Obesity, unspecified: Secondary | ICD-10-CM

## 2014-08-28 NOTE — Progress Notes (Signed)
Doing well. No sz. activity since months before pregnancy. Saw Dr. Seward Grater, (neurology in [facility]) and next appt in Dec.  Discussed at length elevated UA S:D and lagging [facility] measurement on [place]. Growth 41st%ile, AFI 20, BPP was 8/8. No EIF. Has appointment for repeat Dopplers in 1 wk. Plan weekly NST at 34 wk.

## 2014-08-28 NOTE — Patient Instructions (Addendum)
Third Trimester of Pregnancy °The third trimester is from week 29 through week 42, months 7 through 9. The third trimester is a time when the fetus is growing rapidly. At the end of the ninth month, the fetus is about 20 inches in length and weighs 6-10 pounds.  °BODY CHANGES °Your body goes through many changes during pregnancy. The changes vary from woman to woman.  °· Your weight will continue to increase. You can expect to gain 25-35 pounds (11-16 kg) by the end of the pregnancy. °· You may begin to get stretch marks on your hips, abdomen, and breasts. °· You may urinate more often because the fetus is moving lower into your pelvis and pressing on your bladder. °· You may develop or continue to have heartburn as a result of your pregnancy. °· You may develop constipation because certain hormones are causing the muscles that push waste through your intestines to slow down. °· You may develop hemorrhoids or swollen, bulging veins (varicose veins). °· You may have pelvic pain because of the weight gain and pregnancy hormones relaxing your joints between the bones in your pelvis. Backaches may result from overexertion of the muscles supporting your posture. °· You may have changes in your hair. These can include thickening of your hair, rapid growth, and changes in texture. Some women also have hair loss during or after pregnancy, or hair that feels dry or thin. Your hair will most likely return to normal after your baby is born. °· Your breasts will continue to grow and be tender. A yellow discharge may leak from your breasts called colostrum. °· Your belly button may stick out. °· You may feel short of breath because of your expanding uterus. °· You may notice the fetus "dropping," or moving lower in your abdomen. °· You may have a bloody mucus discharge. This usually occurs a few days to a week before labor begins. °· Your cervix becomes thin and soft (effaced) near your due date. °WHAT TO EXPECT AT YOUR PRENATAL  EXAMS  °You will have prenatal exams every 2 weeks until week 36. Then, you will have weekly prenatal exams. During a routine prenatal visit: °· You will be weighed to make sure you and the fetus are growing normally. °· Your blood pressure is taken. °· Your abdomen will be measured to track your baby's growth. °· The fetal heartbeat will be listened to. °· Any test results from the previous visit will be discussed. °· You may have a cervical check near your due date to see if you have effaced. °At around 36 weeks, your caregiver will check your cervix. At the same time, your caregiver will also perform a test on the secretions of the vaginal tissue. This test is to determine if a type of bacteria, Group B streptococcus, is present. Your caregiver will explain this further. °Your caregiver may ask you: °· What your birth plan is. °· How you are feeling. °· If you are feeling the baby move. °· If you have had any abnormal symptoms, such as leaking fluid, bleeding, severe headaches, or abdominal cramping. °· If you have any questions. °Other tests or screenings that may be performed during your third trimester include: °· Blood tests that check for low iron levels (anemia). °· Fetal testing to check the health, activity level, and growth of the fetus. Testing is done if you have certain medical conditions or if there are problems during the pregnancy. °FALSE LABOR °You may feel small, irregular contractions that   eventually go away. These are called Braxton Hicks contractions, or false labor. Contractions may last for hours, days, or even weeks before true labor sets in. If contractions come at regular intervals, intensify, or become painful, it is best to be seen by your caregiver.  °SIGNS OF LABOR  °· Menstrual-like cramps. °· Contractions that are 5 minutes apart or less. °· Contractions that start on the top of the uterus and spread down to the lower abdomen and back. °· A sense of increased pelvic pressure or back  pain. °· A watery or bloody mucus discharge that comes from the vagina. °If you have any of these signs before the 37th week of pregnancy, call your caregiver right away. You need to go to the hospital to get checked immediately. °HOME CARE INSTRUCTIONS  °· Avoid all smoking, herbs, alcohol, and unprescribed drugs. These chemicals affect the formation and growth of the baby. °· Follow your caregiver's instructions regarding medicine use. There are medicines that are either safe or unsafe to take during pregnancy. °· Exercise only as directed by your caregiver. Experiencing uterine cramps is a good sign to stop exercising. °· Continue to eat regular, healthy meals. °· Wear a good support bra for breast tenderness. °· Do not use hot tubs, steam rooms, or saunas. °· Wear your seat belt at all times when driving. °· Avoid raw meat, uncooked cheese, cat litter boxes, and soil used by cats. These carry germs that can cause birth defects in the baby. °· Take your prenatal vitamins. °· Try taking a stool softener (if your caregiver approves) if you develop constipation. Eat more high-fiber foods, such as fresh vegetables or fruit and whole grains. Drink plenty of fluids to keep your urine clear or pale yellow. °· Take warm sitz baths to soothe any pain or discomfort caused by hemorrhoids. Use hemorrhoid cream if your caregiver approves. °· If you develop varicose veins, wear support hose. Elevate your feet for 15 minutes, 3-4 times a day. Limit salt in your diet. °· Avoid heavy lifting, wear low heal shoes, and practice good posture. °· Rest a lot with your legs elevated if you have leg cramps or low back pain. °· Visit your dentist if you have not gone during your pregnancy. Use a soft toothbrush to brush your teeth and be gentle when you floss. °· A sexual relationship may be continued unless your caregiver directs you otherwise. °· Do not travel far distances unless it is absolutely necessary and only with the approval  of your caregiver. °· Take prenatal classes to understand, practice, and ask questions about the labor and delivery. °· Make a trial run to the hospital. °· Pack your hospital bag. °· Prepare the baby's nursery. °· Continue to go to all your prenatal visits as directed by your caregiver. °SEEK MEDICAL CARE IF: °· You are unsure if you are in labor or if your water has broken. °· You have dizziness. °· You have mild pelvic cramps, pelvic pressure, or nagging pain in your abdominal area. °· You have persistent nausea, vomiting, or diarrhea. °· You have a bad smelling vaginal discharge. °· You have pain with urination. °SEEK IMMEDIATE MEDICAL CARE IF:  °· You have a fever. °· You are leaking fluid from your vagina. °· You have spotting or bleeding from your vagina. °· You have severe abdominal cramping or pain. °· You have rapid weight loss or gain. °· You have shortness of breath with chest pain. °· You notice sudden or extreme swelling   of your face, hands, ankles, feet, or legs. °· You have not felt your baby move in over an hour. °· You have severe headaches that do not go away with medicine. °· You have vision changes. °Document Released: 11/21/2001 Document Revised: 12/02/2013 Document Reviewed: 01/28/2013 °[employer]® Patient Information ©2015 [employer]. This information is not intended to replace advice given to you by your health care provider. Make sure you discuss any questions you have with your health care provider. °Fetal Movement Counts °Patient Name: __________________________________________________ Patient Due Date: ____________________ °Performing a fetal movement count is highly recommended in high-risk pregnancies, but it is good for every pregnant woman to do. Your health care provider may ask you to start counting fetal movements at 28 weeks of the pregnancy. Fetal movements often increase: °· After eating a full meal. °· After physical activity. °· After eating or drinking something sweet or  cold. °· At rest. °Pay attention to when you feel the baby is most active. This will help you notice a pattern of your baby's sleep and wake cycles and what factors contribute to an increase in fetal movement. It is important to perform a fetal movement count at the same time each day when your baby is normally most active.  °HOW TO COUNT FETAL MOVEMENTS °1. Find a quiet and comfortable area to sit or lie down on your left side. Lying on your left side provides the best blood and oxygen circulation to your baby. °2. Write down the day and time on a sheet of paper or in a journal. °3. Start counting kicks, flutters, swishes, rolls, or jabs in a 2-hour period. You should feel at least 10 movements within 2 hours. °4. If you do not feel 10 movements in 2 hours, wait 2-3 hours and count again. Look for a change in the pattern or not enough counts in 2 hours. °SEEK MEDICAL CARE IF: °· You feel less than 10 counts in 2 hours, tried twice. °· There is no movement in over an hour. °· The pattern is changing or taking longer each day to reach 10 counts in 2 hours. °· You feel the baby is not moving as he or she usually does. °Date: ____________ Movements: ____________ Start time: ____________ Finish time: ____________  °Date: ____________ Movements: ____________ Start time: ____________ Finish time: ____________ °Date: ____________ Movements: ____________ Start time: ____________ Finish time: ____________ °Date: ____________ Movements: ____________ Start time: ____________ Finish time: ____________ °Date: ____________ Movements: ____________ Start time: ____________ Finish time: ____________ °Date: ____________ Movements: ____________ Start time: ____________ Finish time: ____________ °Date: ____________ Movements: ____________ Start time: ____________ Finish time: ____________ °Date: ____________ Movements: ____________ Start time: ____________ Finish time: ____________  °Date: ____________ Movements: ____________ Start  time: ____________ Finish time: ____________ °Date: ____________ Movements: ____________ Start time: ____________ Finish time: ____________ °Date: ____________ Movements: ____________ Start time: ____________ Finish time: ____________ °Date: ____________ Movements: ____________ Start time: ____________ Finish time: ____________ °Date: ____________ Movements: ____________ Start time: ____________ Finish time: ____________ °Date: ____________ Movements: ____________ Start time: ____________ Finish time: ____________ °Date: ____________ Movements: ____________ Start time: ____________ Finish time: ____________  °Date: ____________ Movements: ____________ Start time: ____________ Finish time: ____________ °Date: ____________ Movements: ____________ Start time: ____________ Finish time: ____________ °Date: ____________ Movements: ____________ Start time: ____________ Finish time: ____________ °Date: ____________ Movements: ____________ Start time: ____________ Finish time: ____________ °Date: ____________ Movements: ____________ Start time: ____________ Finish time: ____________ °Date: ____________ Movements: ____________ Start time: ____________ Finish time: ____________ °Date: ____________ Movements: ____________ Start time: ____________ Finish time:   ____________  °Date: ____________ Movements: ____________ Start time: ____________ Finish time: ____________ °Date: ____________ Movements: ____________ Start time: ____________ Finish time: ____________ °Date: ____________ Movements: ____________ Start time: ____________ Finish time: ____________ °Date: ____________ Movements: ____________ Start time: ____________ Finish time: ____________ °Date: ____________ Movements: ____________ Start time: ____________ Finish time: ____________ °Date: ____________ Movements: ____________ Start time: ____________ Finish time: ____________ °Date: ____________ Movements: ____________ Start time: ____________ Finish time: ____________    °Date: ____________ Movements: ____________ Start time: ____________ Finish time: ____________ °Date: ____________ Movements: ____________ Start time: ____________ Finish time: ____________ °Date: ____________ Movements: ____________ Start time: ____________ Finish time: ____________ °Date: ____________ Movements: ____________ Start time: ____________ Finish time: ____________ °Date: ____________ Movements: ____________ Start time: ____________ Finish time: ____________ °Date: ____________ Movements: ____________ Start time: ____________ Finish time: ____________ °Date: ____________ Movements: ____________ Start time: ____________ Finish time: ____________  °Date: ____________ Movements: ____________ Start time: ____________ Finish time: ____________ °Date: ____________ Movements: ____________ Start time: ____________ Finish time: ____________ °Date: ____________ Movements: ____________ Start time: ____________ Finish time: ____________ °Date: ____________ Movements: ____________ Start time: ____________ Finish time: ____________ °Date: ____________ Movements: ____________ Start time: ____________ Finish time: ____________ °Date: ____________ Movements: ____________ Start time: ____________ Finish time: ____________ °Date: ____________ Movements: ____________ Start time: ____________ Finish time: ____________  °Date: ____________ Movements: ____________ Start time: ____________ Finish time: ____________ °Date: ____________ Movements: ____________ Start time: ____________ Finish time: ____________ °Date: ____________ Movements: ____________ Start time: ____________ Finish time: ____________ °Date: ____________ Movements: ____________ Start time: ____________ Finish time: ____________ °Date: ____________ Movements: ____________ Start time: ____________ Finish time: ____________ °Date: ____________ Movements: ____________ Start time: ____________ Finish time: ____________ °Date: ____________ Movements: ____________  Start time: ____________ Finish time: ____________  °Date: ____________ Movements: ____________ Start time: ____________ Finish time: ____________ °Date: ____________ Movements: ____________ Start time: ____________ Finish time: ____________ °Date: ____________ Movements: ____________ Start time: ____________ Finish time: ____________ °Date: ____________ Movements: ____________ Start time: ____________ Finish time: ____________ °Date: ____________ Movements: ____________ Start time: ____________ Finish time: ____________ °Date: ____________ Movements: ____________ Start time: ____________ Finish time: ____________ °Document Released: 12/27/2006 Document Revised: 04/13/2014 Document Reviewed: 09/23/2012 °[employer]® Patient Information ©2015 [employer]. This information is not intended to replace advice given to you by your health care provider. Make sure you discuss any questions you have with your health care provider. ° °

## 2014-08-31 ENCOUNTER — Encounter: Payer: Self-pay | Admitting: Obstetrics & Gynecology

## 2014-09-01 ENCOUNTER — Other Ambulatory Visit (HOSPITAL_COMMUNITY): Payer: Self-pay | Admitting: Maternal and Fetal Medicine

## 2014-09-01 DIAGNOSIS — O359XX0 Maternal care for (suspected) fetal abnormality and damage, unspecified, not applicable or unspecified: Secondary | ICD-10-CM

## 2014-09-01 DIAGNOSIS — R9389 Abnormal findings on diagnostic imaging of other specified body structures: Secondary | ICD-10-CM

## 2014-09-01 DIAGNOSIS — O358XX0 Maternal care for other (suspected) fetal abnormality and damage, not applicable or unspecified: Secondary | ICD-10-CM

## 2014-09-01 DIAGNOSIS — G40909 Epilepsy, unspecified, not intractable, without status epilepticus: Secondary | ICD-10-CM

## 2014-09-01 DIAGNOSIS — O99350 Diseases of the nervous system complicating pregnancy, unspecified trimester: Secondary | ICD-10-CM

## 2014-09-03 ENCOUNTER — Ambulatory Visit (HOSPITAL_COMMUNITY)
Admission: RE | Admit: 2014-09-03 | Discharge: 2014-09-03 | Disposition: A | Discharge status: Home / Self Care | Payer: 59 | Source: Ambulatory Visit | Attending: Obstetrics & Gynecology | Admitting: Obstetrics & Gynecology

## 2014-09-03 ENCOUNTER — Encounter (HOSPITAL_COMMUNITY): Payer: Self-pay

## 2014-09-03 VITALS — BP 111/69 | HR 97 | Wt 242.0 lb

## 2014-09-03 DIAGNOSIS — O358XX0 Maternal care for other (suspected) fetal abnormality and damage, not applicable or unspecified: Secondary | ICD-10-CM | POA: Insufficient documentation

## 2014-09-03 DIAGNOSIS — O365990 Maternal care for other known or suspected poor fetal growth, unspecified trimester, not applicable or unspecified: Secondary | ICD-10-CM | POA: Diagnosis not present

## 2014-09-03 DIAGNOSIS — G40909 Epilepsy, unspecified, not intractable, without status epilepticus: Secondary | ICD-10-CM | POA: Insufficient documentation

## 2014-09-03 DIAGNOSIS — O99353 Diseases of the nervous system complicating pregnancy, third trimester: Secondary | ICD-10-CM

## 2014-09-03 DIAGNOSIS — O99350 Diseases of the nervous system complicating pregnancy, unspecified trimester: Secondary | ICD-10-CM

## 2014-09-03 DIAGNOSIS — O365931 Maternal care for other known or suspected poor fetal growth, third trimester, fetus 1: Secondary | ICD-10-CM

## 2014-09-03 DIAGNOSIS — R9389 Abnormal findings on diagnostic imaging of other specified body structures: Secondary | ICD-10-CM

## 2014-09-10 ENCOUNTER — Encounter (HOSPITAL_COMMUNITY): Payer: Self-pay

## 2014-09-10 ENCOUNTER — Ambulatory Visit (HOSPITAL_COMMUNITY)
Admission: RE | Admit: 2014-09-10 | Discharge: 2014-09-10 | Disposition: A | Discharge status: Home / Self Care | Payer: 59 | Source: Ambulatory Visit | Attending: Obstetrics & Gynecology | Admitting: Obstetrics & Gynecology

## 2014-09-10 VITALS — BP 107/70 | HR 85 | Wt 242.0 lb

## 2014-09-10 DIAGNOSIS — Z3A32 32 weeks gestation of pregnancy: Secondary | ICD-10-CM

## 2014-09-10 DIAGNOSIS — O99350 Diseases of the nervous system complicating pregnancy, unspecified trimester: Secondary | ICD-10-CM | POA: Diagnosis not present

## 2014-09-10 DIAGNOSIS — O99213 Obesity complicating pregnancy, third trimester: Secondary | ICD-10-CM

## 2014-09-10 DIAGNOSIS — O365930 Maternal care for other known or suspected poor fetal growth, third trimester, not applicable or unspecified: Secondary | ICD-10-CM

## 2014-09-10 DIAGNOSIS — R9389 Abnormal findings on diagnostic imaging of other specified body structures: Secondary | ICD-10-CM

## 2014-09-10 DIAGNOSIS — G40909 Epilepsy, unspecified, not intractable, without status epilepticus: Secondary | ICD-10-CM

## 2014-09-10 DIAGNOSIS — O359XX0 Maternal care for (suspected) fetal abnormality and damage, unspecified, not applicable or unspecified: Secondary | ICD-10-CM

## 2014-09-10 DIAGNOSIS — O99353 Diseases of the nervous system complicating pregnancy, third trimester: Secondary | ICD-10-CM

## 2014-09-10 NOTE — ED Notes (Signed)
Fetal kick count instructions reviewed with pt.  Pt voices understanding.

## 2014-09-11 ENCOUNTER — Ambulatory Visit (INDEPENDENT_AMBULATORY_CARE_PROVIDER_SITE_OTHER): Payer: 59 | Admitting: Advanced Practice Midwife

## 2014-09-11 VITALS — BP 109/67 | HR 93 | Wt 244.0 lb

## 2014-09-11 DIAGNOSIS — G40909 Epilepsy, unspecified, not intractable, without status epilepticus: Secondary | ICD-10-CM

## 2014-09-11 DIAGNOSIS — Z3483 Encounter for supervision of other normal pregnancy, third trimester: Secondary | ICD-10-CM

## 2014-09-11 DIAGNOSIS — O99353 Diseases of the nervous system complicating pregnancy, third trimester: Secondary | ICD-10-CM

## 2014-09-11 NOTE — Progress Notes (Signed)
Doing well.  Good fetal movement, denies vaginal bleeding, LOF, regular contractions.  Has increased seizure medication, Ok per neurologist.  Discussed normal pregnancy changes and needs for increased medication common.  Pt started twice weekly NST today, had dopplers/AFI yesterday.  Has dopplers/AFI at [facility] scheduled next week. NST-R today, lots of fetal movement, difficult to obtain tracing.  Pt reports having sugar before coming to appointment.

## 2014-09-14 ENCOUNTER — Ambulatory Visit (INDEPENDENT_AMBULATORY_CARE_PROVIDER_SITE_OTHER): Payer: 59 | Admitting: *Deleted

## 2014-09-15 ENCOUNTER — Other Ambulatory Visit (HOSPITAL_COMMUNITY): Payer: Self-pay | Admitting: Maternal and Fetal Medicine

## 2014-09-15 DIAGNOSIS — O365930 Maternal care for other known or suspected poor fetal growth, third trimester, not applicable or unspecified: Secondary | ICD-10-CM

## 2014-09-15 DIAGNOSIS — O358XX1 Maternal care for other (suspected) fetal abnormality and damage, fetus 1: Secondary | ICD-10-CM

## 2014-09-15 DIAGNOSIS — O99213 Obesity complicating pregnancy, third trimester: Secondary | ICD-10-CM

## 2014-09-15 DIAGNOSIS — O99353 Diseases of the nervous system complicating pregnancy, third trimester: Secondary | ICD-10-CM

## 2014-09-17 ENCOUNTER — Other Ambulatory Visit: Payer: 59

## 2014-09-17 ENCOUNTER — Ambulatory Visit (HOSPITAL_COMMUNITY): Admission: RE | Admit: 2014-09-17 | Payer: 59 | Source: Ambulatory Visit

## 2014-09-17 ENCOUNTER — Ambulatory Visit (HOSPITAL_COMMUNITY)
Admission: RE | Admit: 2014-09-17 | Discharge: 2014-09-17 | Disposition: A | Discharge status: Home / Self Care | Payer: 59 | Source: Ambulatory Visit | Attending: Obstetrics & Gynecology | Admitting: Obstetrics & Gynecology

## 2014-09-17 DIAGNOSIS — O99210 Obesity complicating pregnancy, unspecified trimester: Secondary | ICD-10-CM | POA: Insufficient documentation

## 2014-09-17 DIAGNOSIS — O358XX0 Maternal care for other (suspected) fetal abnormality and damage, not applicable or unspecified: Secondary | ICD-10-CM | POA: Insufficient documentation

## 2014-09-17 DIAGNOSIS — O99353 Diseases of the nervous system complicating pregnancy, third trimester: Secondary | ICD-10-CM

## 2014-09-17 DIAGNOSIS — O365990 Maternal care for other known or suspected poor fetal growth, unspecified trimester, not applicable or unspecified: Secondary | ICD-10-CM | POA: Diagnosis not present

## 2014-09-17 DIAGNOSIS — O358XX1 Maternal care for other (suspected) fetal abnormality and damage, fetus 1: Secondary | ICD-10-CM

## 2014-09-17 DIAGNOSIS — Z3A00 Weeks of gestation of pregnancy not specified: Secondary | ICD-10-CM | POA: Diagnosis not present

## 2014-09-17 DIAGNOSIS — G40909 Epilepsy, unspecified, not intractable, without status epilepticus: Secondary | ICD-10-CM | POA: Diagnosis not present

## 2014-09-17 DIAGNOSIS — O99350 Diseases of the nervous system complicating pregnancy, unspecified trimester: Secondary | ICD-10-CM | POA: Insufficient documentation

## 2014-09-17 DIAGNOSIS — O365930 Maternal care for other known or suspected poor fetal growth, third trimester, not applicable or unspecified: Secondary | ICD-10-CM

## 2014-09-17 DIAGNOSIS — O99213 Obesity complicating pregnancy, third trimester: Secondary | ICD-10-CM

## 2014-09-21 ENCOUNTER — Ambulatory Visit (INDEPENDENT_AMBULATORY_CARE_PROVIDER_SITE_OTHER): Payer: 59 | Admitting: Advanced Practice Midwife

## 2014-09-21 NOTE — Progress Notes (Signed)
Doing well.  Good fetal movement, denies vaginal bleeding, LOF, regular contractions.  Had U/S on 10/8, EDC readjusted to 11/13  r/t records of early sono which were more consistent with LMP dating.  MFM rescheduled U/S in 4 weeks.  Was doing twice weekly NST and weekly AFI/dopplers.  Unsure of plan to continue testing.  Called left message with Dr Sherrie Georgeecker.   Addendum:  Dr Sherrie Georgeecker reviewed chart and U/S.  May stop twice weekly testing and frequent U/S based on EDD of 10/23/14.  Growth U/S scheduled in 4 weeks.

## 2014-09-24 ENCOUNTER — Ambulatory Visit (HOSPITAL_COMMUNITY): Payer: 59

## 2014-09-30 ENCOUNTER — Telehealth: Payer: Self-pay | Admitting: *Deleted

## 2014-09-30 NOTE — Telephone Encounter (Signed)
See previous message

## 2014-09-30 NOTE — Telephone Encounter (Signed)
Message copied by Granville LewisLARK, Kristy Rodgers L on Wed Sep 30, 2014 10:05 AM ------      Message from: LEFTWICH-KIRBY, LISA A      Created: Wed Sep 30, 2014  9:28 AM      Regarding: Plan of Care changes       Kristy Rodgers,            I talked with Dr Sherrie Georgeecker and put a note in this pt chart last week.  Her EDC was changed so she does not need twice weekly testing or frequent U/S anymore per Dr Sherrie Georgeecker.  Please call pt to let her know she will be on routine visit schedule only and keep f/u ultrasound already scheduled only.  Thank you.                  ----- Message -----         From: Kristy Rodgers         Sent: 09/29/2014   2:17 PM           To: Hurshel PartyLisa A Leftwich-Kirby, CNM            Rockwell AlexandriaHey Lisa,      Kristy Rodgers wanted to know if you [facility] about the plan for her?            You can call me or Kristy Rodgers, or call the patient.            Thank you,      Tanya       ------

## 2014-09-30 NOTE — Telephone Encounter (Signed)
Pt notified of change in EDD and Collene GobbleLisa Leftwich CNM instructions on only needed routine OBF per Dr Sherrie Georgeecker.

## 2014-09-30 NOTE — Telephone Encounter (Signed)
Message copied by Granville LewisLARK, Hien Perreira L on Wed Sep 30, 2014 10:04 AM ------      Message from: LEFTWICH-KIRBY, LISA A      Created: Wed Sep 30, 2014  9:28 AM      Regarding: Plan of Care changes       Mervyn GayLora,            I talked with Dr Sherrie Georgeecker and put a note in this pt chart last week.  Her EDC was changed so she does not need twice weekly testing or frequent U/S anymore per Dr Sherrie Georgeecker.  Please call pt to let her know she will be on routine visit schedule only and keep f/u ultrasound already scheduled only.  Thank you.                  ----- Message -----         From: Sherril Conganya Denise Buckson         Sent: 09/29/2014   2:17 PM           To: Hurshel PartyLisa A Leftwich-Kirby, CNM            Rockwell AlexandriaHey Lisa,      Weyman Rodneynna Froemming wanted to know if [facility] about the plan for her?            You can call me or Mervyn GayLora, or call the patient.            Thank you,      Tanya       ------

## 2014-10-05 ENCOUNTER — Encounter: Payer: Self-pay | Admitting: Advanced Practice Midwife

## 2014-10-05 ENCOUNTER — Ambulatory Visit (INDEPENDENT_AMBULATORY_CARE_PROVIDER_SITE_OTHER): Payer: 59 | Admitting: Advanced Practice Midwife

## 2014-10-05 VITALS — BP 98/68 | HR 110 | Wt 246.0 lb

## 2014-10-05 DIAGNOSIS — Z3493 Encounter for supervision of normal pregnancy, unspecified, third trimester: Secondary | ICD-10-CM

## 2014-10-05 DIAGNOSIS — Z36 Encounter for antenatal screening of mother: Secondary | ICD-10-CM

## 2014-10-05 LAB — OB RESULTS CONSOLE GBS: STREP GROUP B AG: NEGATIVE

## 2014-10-05 LAB — OB RESULTS CONSOLE GC/CHLAMYDIA
Chlamydia: NEGATIVE
Gonorrhea: NEGATIVE

## 2014-10-05 NOTE — Progress Notes (Signed)
Lots of questions. Discussed waterbirth, needs to take class, etc. Not sure.  Also asking about emotional lability lately. Some days totally fine, other days has some anger and some anxiety. Discussed counseling and referred to [facility]. May need watching for PP Depression.

## 2014-10-05 NOTE — Patient Instructions (Signed)

## 2014-10-06 ENCOUNTER — Other Ambulatory Visit (HOSPITAL_COMMUNITY): Payer: Self-pay | Admitting: Obstetrics and Gynecology

## 2014-10-06 DIAGNOSIS — O358XX1 Maternal care for other (suspected) fetal abnormality and damage, fetus 1: Secondary | ICD-10-CM

## 2014-10-06 DIAGNOSIS — O35BXX1 Maternal care for other (suspected) fetal abnormality and damage, fetal cardiac anomalies, fetus 1: Secondary | ICD-10-CM

## 2014-10-06 DIAGNOSIS — O99213 Obesity complicating pregnancy, third trimester: Secondary | ICD-10-CM

## 2014-10-06 DIAGNOSIS — O99353 Diseases of the nervous system complicating pregnancy, third trimester: Secondary | ICD-10-CM

## 2014-10-06 DIAGNOSIS — O365931 Maternal care for other known or suspected poor fetal growth, third trimester, fetus 1: Secondary | ICD-10-CM

## 2014-10-06 LAB — GC/CHLAMYDIA PROBE AMP
CT Probe RNA: NEGATIVE
GC Probe RNA: NEGATIVE

## 2014-10-07 LAB — CULTURE, BETA STREP (GROUP B ONLY)

## 2014-10-10 ENCOUNTER — Encounter (HOSPITAL_COMMUNITY): Payer: Self-pay | Admitting: Advanced Practice Midwife

## 2014-10-12 ENCOUNTER — Encounter: Payer: Self-pay | Admitting: Advanced Practice Midwife

## 2014-10-12 ENCOUNTER — Ambulatory Visit (INDEPENDENT_AMBULATORY_CARE_PROVIDER_SITE_OTHER): Payer: 59 | Admitting: Advanced Practice Midwife

## 2014-10-12 VITALS — BP 98/60 | HR 88 | Wt 248.0 lb

## 2014-10-12 DIAGNOSIS — Z3493 Encounter for supervision of normal pregnancy, unspecified, third trimester: Secondary | ICD-10-CM

## 2014-10-12 NOTE — Patient Instructions (Signed)

## 2014-10-12 NOTE — Progress Notes (Signed)
More contractions this week. Vertex high, confirmed vertex by ultrasound

## 2014-10-19 ENCOUNTER — Ambulatory Visit (INDEPENDENT_AMBULATORY_CARE_PROVIDER_SITE_OTHER): Payer: 59 | Admitting: Advanced Practice Midwife

## 2014-10-19 VITALS — BP 100/80 | HR 84 | Wt 248.0 lb

## 2014-10-19 DIAGNOSIS — Z3493 Encounter for supervision of normal pregnancy, unspecified, third trimester: Secondary | ICD-10-CM

## 2014-10-19 NOTE — Progress Notes (Signed)
Reviewed IOL if goes postdates. Labor precautions. Unable to sweep membranes, tight 1cm

## 2014-10-19 NOTE — Patient Instructions (Signed)

## 2014-10-22 ENCOUNTER — Ambulatory Visit (HOSPITAL_COMMUNITY): Payer: 59

## 2014-10-22 ENCOUNTER — Ambulatory Visit (HOSPITAL_COMMUNITY)
Admission: RE | Admit: 2014-10-22 | Discharge: 2014-10-22 | Disposition: A | Discharge status: Home / Self Care | Payer: 59 | Source: Ambulatory Visit | Attending: Obstetrics and Gynecology | Admitting: Obstetrics and Gynecology

## 2014-10-22 DIAGNOSIS — O35BXX0 Maternal care for other (suspected) fetal abnormality and damage, fetal cardiac anomalies, not applicable or unspecified: Secondary | ICD-10-CM | POA: Insufficient documentation

## 2014-10-22 DIAGNOSIS — O365931 Maternal care for other known or suspected poor fetal growth, third trimester, fetus 1: Secondary | ICD-10-CM | POA: Diagnosis present

## 2014-10-22 DIAGNOSIS — O358XX0 Maternal care for other (suspected) fetal abnormality and damage, not applicable or unspecified: Secondary | ICD-10-CM | POA: Insufficient documentation

## 2014-10-22 DIAGNOSIS — O99213 Obesity complicating pregnancy, third trimester: Secondary | ICD-10-CM | POA: Insufficient documentation

## 2014-10-22 DIAGNOSIS — O35BXX1 Maternal care for other (suspected) fetal abnormality and damage, fetal cardiac anomalies, fetus 1: Secondary | ICD-10-CM

## 2014-10-22 DIAGNOSIS — O99353 Diseases of the nervous system complicating pregnancy, third trimester: Secondary | ICD-10-CM | POA: Diagnosis present

## 2014-10-22 DIAGNOSIS — G40909 Epilepsy, unspecified, not intractable, without status epilepticus: Secondary | ICD-10-CM | POA: Insufficient documentation

## 2014-10-22 DIAGNOSIS — O358XX1 Maternal care for other (suspected) fetal abnormality and damage, fetus 1: Secondary | ICD-10-CM | POA: Diagnosis present

## 2014-10-22 DIAGNOSIS — O99350 Diseases of the nervous system complicating pregnancy, unspecified trimester: Secondary | ICD-10-CM

## 2014-10-22 DIAGNOSIS — Z3A39 39 weeks gestation of pregnancy: Secondary | ICD-10-CM | POA: Insufficient documentation

## 2014-10-23 ENCOUNTER — Inpatient Hospital Stay (HOSPITAL_COMMUNITY)
Admission: AD | Admit: 2014-10-23 | Payer: PRIVATE HEALTH INSURANCE | Source: Ambulatory Visit | Admitting: Obstetrics & Gynecology

## 2014-10-26 ENCOUNTER — Telehealth (HOSPITAL_COMMUNITY): Payer: Self-pay | Admitting: *Deleted

## 2014-10-26 ENCOUNTER — Ambulatory Visit (INDEPENDENT_AMBULATORY_CARE_PROVIDER_SITE_OTHER): Payer: 59 | Admitting: Advanced Practice Midwife

## 2014-10-26 VITALS — BP 117/63 | HR 88 | Wt 252.0 lb

## 2014-10-26 DIAGNOSIS — O480 Post-term pregnancy: Secondary | ICD-10-CM

## 2014-10-26 DIAGNOSIS — Z3493 Encounter for supervision of normal pregnancy, unspecified, third trimester: Secondary | ICD-10-CM

## 2014-10-26 NOTE — Progress Notes (Signed)
Plan IOL Thursday with Foley balloon. Needs to do it that day as FOB is going out of town.  Discussed long labor.  NST reactive, rare contractions

## 2014-10-26 NOTE — Patient Instructions (Signed)

## 2014-10-26 NOTE — Telephone Encounter (Signed)
Preadmission screen  

## 2014-10-29 ENCOUNTER — Inpatient Hospital Stay (HOSPITAL_COMMUNITY)
Admission: RE | Admit: 2014-10-29 | Discharge: 2014-11-03 | DRG: 775 | Disposition: A | Discharge status: Home / Self Care | Payer: 59 | Source: Ambulatory Visit | Attending: Obstetrics and Gynecology | Admitting: Obstetrics and Gynecology

## 2014-10-29 ENCOUNTER — Encounter (HOSPITAL_COMMUNITY): Payer: Self-pay

## 2014-10-29 DIAGNOSIS — Z809 Family history of malignant neoplasm, unspecified: Secondary | ICD-10-CM | POA: Diagnosis not present

## 2014-10-29 DIAGNOSIS — F419 Anxiety disorder, unspecified: Secondary | ICD-10-CM | POA: Diagnosis present

## 2014-10-29 DIAGNOSIS — Z3A41 41 weeks gestation of pregnancy: Secondary | ICD-10-CM | POA: Diagnosis present

## 2014-10-29 DIAGNOSIS — Z3490 Encounter for supervision of normal pregnancy, unspecified, unspecified trimester: Secondary | ICD-10-CM

## 2014-10-29 DIAGNOSIS — M1990 Unspecified osteoarthritis, unspecified site: Secondary | ICD-10-CM | POA: Diagnosis present

## 2014-10-29 DIAGNOSIS — Z6841 Body Mass Index (BMI) 40.0 and over, adult: Secondary | ICD-10-CM | POA: Diagnosis not present

## 2014-10-29 DIAGNOSIS — O99284 Endocrine, nutritional and metabolic diseases complicating childbirth: Secondary | ICD-10-CM | POA: Diagnosis present

## 2014-10-29 DIAGNOSIS — O99214 Obesity complicating childbirth: Secondary | ICD-10-CM | POA: Diagnosis present

## 2014-10-29 DIAGNOSIS — G40909 Epilepsy, unspecified, not intractable, without status epilepticus: Secondary | ICD-10-CM

## 2014-10-29 DIAGNOSIS — O99353 Diseases of the nervous system complicating pregnancy, third trimester: Secondary | ICD-10-CM

## 2014-10-29 DIAGNOSIS — O480 Post-term pregnancy: Secondary | ICD-10-CM | POA: Diagnosis present

## 2014-10-29 DIAGNOSIS — K219 Gastro-esophageal reflux disease without esophagitis: Secondary | ICD-10-CM | POA: Diagnosis present

## 2014-10-29 DIAGNOSIS — E039 Hypothyroidism, unspecified: Secondary | ICD-10-CM | POA: Diagnosis present

## 2014-10-29 LAB — CBC
HEMATOCRIT: 34.5 % — AB (ref 36.0–46.0)
Hemoglobin: 11.6 g/dL — ABNORMAL LOW (ref 12.0–15.0)
MCH: 29.8 pg (ref 26.0–34.0)
MCHC: 33.6 g/dL (ref 30.0–36.0)
MCV: 88.7 fL (ref 78.0–100.0)
Platelets: 201 10*3/uL (ref 150–400)
RBC: 3.89 MIL/uL (ref 3.87–5.11)
RDW: 13.1 % (ref 11.5–15.5)
WBC: 8.5 10*3/uL (ref 4.0–10.5)

## 2014-10-29 LAB — TYPE AND SCREEN
ABO/RH(D): O POS
ANTIBODY SCREEN: NEGATIVE

## 2014-10-29 LAB — RPR

## 2014-10-29 LAB — ABO/RH: ABO/RH(D): O POS

## 2014-10-29 MED ORDER — OXYTOCIN 40 UNITS IN LACTATED RINGERS INFUSION - SIMPLE MED
62.5000 mL/h | INTRAVENOUS | Status: DC
  Administered 2014-11-01: 62.5 mL/h via INTRAVENOUS
  Filled 2014-10-29: qty 1000

## 2014-10-29 MED ORDER — LACTATED RINGERS IV SOLN
INTRAVENOUS | Status: DC
  Administered 2014-10-29 (×2): 125 mL/h via INTRAVENOUS
  Administered 2014-10-30 – 2014-10-31 (×3): via INTRAVENOUS

## 2014-10-29 MED ORDER — ACETAMINOPHEN 325 MG PO TABS: 650.0000 mg | ORAL_TABLET | ORAL | Status: DC | PRN

## 2014-10-29 MED ORDER — OXYCODONE-ACETAMINOPHEN 5-325 MG PO TABS: 2.0000 | ORAL_TABLET | ORAL | Status: DC | PRN

## 2014-10-29 MED ORDER — LACTATED RINGERS IV SOLN
500.0000 mL | INTRAVENOUS | Status: DC | PRN
  Administered 2014-10-30 – 2014-10-31 (×3): 500 mL via INTRAVENOUS

## 2014-10-29 MED ORDER — CITRIC ACID-SODIUM CITRATE 334-500 MG/5ML PO SOLN: 30.0000 mL | ORAL | Status: DC | PRN

## 2014-10-29 MED ORDER — OXYCODONE-ACETAMINOPHEN 5-325 MG PO TABS: 1.0000 | ORAL_TABLET | ORAL | Status: DC | PRN

## 2014-10-29 MED ORDER — OXYTOCIN 40 UNITS IN LACTATED RINGERS INFUSION - SIMPLE MED
1.0000 m[IU]/min | INTRAVENOUS | Status: DC
  Administered 2014-10-29: 2 m[IU]/min via INTRAVENOUS

## 2014-10-29 MED ORDER — OXYTOCIN BOLUS FROM INFUSION: 500.0000 mL | INTRAVENOUS | Status: DC

## 2014-10-29 MED ORDER — LAMOTRIGINE 200 MG PO TABS
200.0000 mg | ORAL_TABLET | Freq: Every day | ORAL | Status: DC
  Administered 2014-10-30 – 2014-11-01 (×3): 200 mg via ORAL
  Filled 2014-10-29 (×3): qty 1

## 2014-10-29 MED ORDER — ONDANSETRON HCL 4 MG/2ML IJ SOLN
4.0000 mg | Freq: Four times a day (QID) | INTRAMUSCULAR | Status: DC | PRN
  Filled 2014-10-29: qty 2

## 2014-10-29 MED ORDER — LIDOCAINE HCL (PF) 1 % IJ SOLN
30.0000 mL | INTRAMUSCULAR | Status: DC | PRN
  Filled 2014-10-29: qty 30

## 2014-10-29 MED ORDER — TERBUTALINE SULFATE 1 MG/ML IJ SOLN: 0.2500 mg | Freq: Once | INTRAMUSCULAR | Status: AC | PRN

## 2014-10-29 MED ORDER — CALCIUM CARBONATE ANTACID 500 MG PO CHEW
1.0000 | CHEWABLE_TABLET | Freq: Two times a day (BID) | ORAL | Status: DC | PRN
  Administered 2014-10-29 – 2014-10-30 (×2): 200 mg via ORAL
  Filled 2014-10-29 (×2): qty 1

## 2014-10-29 MED ORDER — MISOPROSTOL 25 MCG QUARTER TABLET
25.0000 ug | ORAL_TABLET | ORAL | Status: DC | PRN
  Filled 2014-10-29: qty 0.25

## 2014-10-29 NOTE — Progress Notes (Signed)
Patient ID: Kristy Rodgers, female   DOB: [birth year], 28 y.o.   MRN: [id number] Kristy Rodgers is a 28 y.o. G1P0000 at 8042w6d.  Subjective: Mild contractions  Objective: BP 99/48 mmHg  Pulse 83  Temp(Src) 98.3 F (36.8 C) (Oral)  Resp 18  Ht 5\' 2"  (1.575 m)  Wt 114.306 kg (252 lb)  BMI 46.08 kg/m2  LMP 01/16/2014   FHT:  FHR: 160 bpm, variability: moderate,  accelerations:  15x15,  decelerations:  none UC:   Q 2-4 minutes, mild  Dilation: 4 Effacement (%): Thick Station: Ballotable Presentation: Vertex Exam by:: Syria Kestner,CNM  Foley Bulb out  Labs: Results for orders placed or performed during the hospital encounter of 10/29/14 (from the past 24 hour(s))  Type and screen     Status: None   Collection Time: 10/29/14  8:05 AM  Result Value Ref Range   ABO/RH(D) O POS    Antibody Screen NEG    Sample Expiration 11/01/2014   ABO/Rh     Status: None   Collection Time: 10/29/14  8:05 AM  Result Value Ref Range   ABO/RH(D) O POS   CBC     Status: Abnormal   Collection Time: 10/29/14  9:02 AM  Result Value Ref Range   WBC 8.5 4.0 - 10.5 K/uL   RBC 3.89 3.87 - 5.11 MIL/uL   Hemoglobin 11.6 (L) 12.0 - 15.0 g/dL   HCT 40.934.5 (L) 81.136.0 - 91.446.0 %   MCV 88.7 78.0 - 100.0 fL   MCH 29.8 26.0 - 34.0 pg   MCHC 33.6 30.0 - 36.0 g/dL   RDW 78.213.1 95.611.5 - 21.315.5 %   Platelets 201 150 - 400 K/uL  RPR     Status: None   Collection Time: 10/29/14  9:02 AM  Result Value Ref Range   RPR NON REAC NON REAC    Assessment / Plan: 1742w6d week IUP Labor: Early Fetal Wellbeing:  Category I Pain Control:  None Anticipated MOD:  NSVD Pitocin  Dorathy KinsmanVirginia Carisha Kantor, CNM 10/29/2014 8:03 PM

## 2014-10-29 NOTE — H&P (Signed)
Kristy Rodgers is a 28 y.o. female G1P0 with history of partial complex seizure disorder presenting for induction of labor. Patient is having a baby girl,EGA of [redacted]weeks 6 days based on LMP (10/23/2014). Patient notes she has received prenatal care and has not have any problems throughout the pregnancy. She did note that she had a seizure last week that lasted for a minute and regularly takes lamotrigene.  Patient notes she feels the baby movement and is having irregular sporadic contractions. Patient denies any LOF and vaginal bleeding.  Clinic [facility]--CNM only  Dating LMP 10/23/2014 C/W informal 6 week US  Genetic Screen 1 Screen: Declined           Quad: Declined       Anatomic US  normal except limited views of the heart  GTT Early:       82        Third trimester: 115  TDaP vaccine   Flu vaccine   GBS  Negative  Contraception None  Baby Food Breast  Circumcision NA  Pediatrician   Support Person Samuel   Patient Active Problem List   Diagnosis Date Noted  . Pregnancy 10/29/2014  . Echogenic focus of heart of fetus affecting antepartum care of mother   . Epilepsy complicating pregnancy, childbirth, or puerperium, antepartum   . Maternal morbid obesity in third trimester, antepartum   . [redacted] weeks gestation of pregnancy   . Elevated umbilical artery dopplers 08/31/2014  . Obesity 08/28/2014  . Seizure disorder in pregnancy, antepartum 03/24/2014  . Supervision of normal pregnancy 03/23/2014  . Vaginal bleeding in pregnant patient at less than [redacted] weeks gestation 03/02/2014    Maternal Medical History:  Reason for admission: Contractions.     OB History    Gravida Para Term Preterm AB TAB SAB Ectopic Multiple Living   1 0 0 0 0 0 0 0 0 0      Past Medical History  Diagnosis Date  . Polycystic ovarian syndrome   . Obesity   . Arthritis   . Reflux   . Anxiety   . Hypothyroid   . Seizures   . Chronic headaches    Past Surgical History  Procedure Laterality Date  .  Wrist fracture surgery      age 28  . Wisdom tooth extraction     Family History: family history includes Cancer in her mother and paternal grandmother; Fibroids in her mother. Social History:  reports that she has never smoked. She has never used smokeless tobacco. She reports that she does not drink alcohol or use illicit drugs.   Prenatal Transfer Tool  Maternal Diabetes: No Genetic Screening: Declined Maternal Ultrasounds/Referrals: Abnormal:  Findings:   Isolated EIF (echogenic intracardiac focus) Fetal Ultrasounds or other Referrals:  None Maternal Substance Abuse:  No Significant Maternal Medications:  Meds include: Other: Lamotrigine  Significant Maternal Lab Results:  None Other Comments:  Maternal seizure disorder  Review of Systems  Constitutional: Negative for fever.  Respiratory: Negative for shortness of breath.   Cardiovascular: Negative for chest pain.  Gastrointestinal: Positive for heartburn. Negative for vomiting and abdominal pain.  Neurological: Negative for headaches.      Blood pressure 109/59, pulse 100, temperature 98.6 F (37 C), temperature source Oral, resp. rate 20, height 5\' 2"  (1.575 m), weight 114.306 kg (252 lb), last menstrual period 01/16/2014. Maternal Exam:  Uterine Assessment: Contraction strength is mild.  Contraction frequency is irregular.   Abdomen: Patient reports no abdominal tenderness. Estimated fetal  weight is 7-14.   Fetal presentation: vertex Presentation verified by BS US.  Introitus: Normal vulva. Normal vagina.  Pelvis: adequate for delivery.   Cervix: Cervix evaluated by sterile speculum exam and digital exam.     Fetal Exam Fetal Monitor Review: Mode: ultrasound.   Baseline rate: 140.  Variability: moderate (6-25 bpm).   Pattern: accelerations present and no decelerations.    Fetal State Assessment: Category I - tracings are normal.     Physical Exam  Constitutional: She is oriented to person, place, and time.  She appears well-developed and well-nourished. No distress.  HENT:  Head: Normocephalic.  Neck: Normal range of motion.  Cardiovascular: Normal rate and regular rhythm.   Respiratory: Effort normal and breath sounds normal. No respiratory distress.  GI: Soft. Bowel sounds are normal.  Neurological: She is alert and oriented to person, place, and time.  Skin: Skin is warm and dry.    Prenatal labs: ABO, Rh: O/POS/-- (03/23 1225) Antibody: NEG (03/23 1225) Rubella: 2.70 (03/23 1225) RPR: NON REAC (08/19 0930)  HBsAg: NEGATIVE (03/23 1225)  HIV: NONREACTIVE (08/19 0930)  GBS: Negative (10/26 0000)    Assessment: 1. Labor: Induction  2. Fetal Wellbeing: Category 1  3. Pain Control: None  4. GBS: Negative   5. 40 week IUP  Plan:  1. Admit to BS per consult with MD 2. Routine L&D orders 3. Analgesia/anesthesia PRN  4. Foley Bulb  5. Continue Lamictal   Karma LewFong, Chelsea K 10/29/2014, 9:20 AM  I was present for the exam and agree with above.  JuniataVirginia Herrick Hartog, CNM 10/29/2014 4:02 PM

## 2014-10-29 NOTE — Progress Notes (Signed)
   Kristy Rodgers is a 28 y.o. G1P0000 at 41107w6d  admitted for induction of labor due to Post dates..  Subjective: Not really feeling contractions, some cramping  Objective: Filed Vitals:   10/29/14 2102 10/29/14 2132 10/29/14 2203 10/29/14 2233  BP: 107/64 112/70 113/64 118/49  Pulse: 85 81 84 91  Temp:      TempSrc:      Resp: 18 18 18 18   Height:      Weight:          FHT:  FHR: 135 bpm, variability: moderate,  accelerations:  Present,  decelerations:  Absent UC:   irregular, every 1.5-3 minutes SVE:   Dilation: 4 Effacement (%): Thick Station: Ballotable Exam by:: SMith,CNM Pitocin @ 14 mu/min  Labs: Lab Results  Component Value Date   WBC 8.5 10/29/2014   HGB 11.6* 10/29/2014   HCT 34.5* 10/29/2014   MCV 88.7 10/29/2014   PLT 201 10/29/2014    Assessment / Plan: IOL for postdates, not in labor  Continue to increase pitocin until active labor  Labor: no Fetal Wellbeing:  Category I Pain Control:  Labor support without medications Anticipated MOD:  NSVD  CRESENZO-DISHMAN,Dannon Perlow 10/29/2014, 11:20 PM

## 2014-10-30 MED ORDER — OXYTOCIN 40 UNITS IN LACTATED RINGERS INFUSION - SIMPLE MED
1.0000 m[IU]/min | INTRAVENOUS | Status: DC
  Administered 2014-10-31 (×2): 2 m[IU]/min via INTRAVENOUS
  Filled 2014-10-30: qty 1000

## 2014-10-30 MED ORDER — MISOPROSTOL 25 MCG QUARTER TABLET
25.0000 ug | ORAL_TABLET | ORAL | Status: DC
  Administered 2014-10-30 – 2014-10-31 (×3): 25 ug via VAGINAL
  Filled 2014-10-30 (×2): qty 1
  Filled 2014-10-30 (×3): qty 0.25

## 2014-10-30 MED ORDER — MISOPROSTOL 50MCG HALF TABLET
50.0000 ug | ORAL_TABLET | Freq: Once | ORAL | Status: AC
  Administered 2014-10-30: 50 ug via ORAL
  Filled 2014-10-30: qty 1

## 2014-10-30 NOTE — Progress Notes (Signed)
   Eben Burownna Pauline Altamura is a 28 y.o. G1P0000 at 6585w0d  admitted for induction of labor due to Post dates..  Subjective:  Still not feeling contractions, despite having been on pitocin for 16 hours Objective: Filed Vitals:   10/30/14 0140 10/30/14 0233 10/30/14 0303 10/30/14 0402  BP: 95/46 93/51 95/46  104/60  Pulse: 70 74 70 68  Temp:      TempSrc:      Resp:   18   Height:      Weight:          FHT:  FHR: 135 bpm, variability: moderate,  accelerations:  Present,  decelerations:  Absent UC:   regular, every 2-3 minutes SVE:   Dilation: 4 Effacement (%): Thick Station: Ballotable Exam by:: Larose KellsK Fraley RN Pitocin @ 18 mu/min  Labs: Lab Results  Component Value Date   WBC 8.5 10/29/2014   HGB 11.6* 10/29/2014   HCT 34.5* 10/29/2014   MCV 88.7 10/29/2014   PLT 201 10/29/2014    Assessment / Plan: IOL for postdates No response to pitocin, cx moderate consistency.  Will try one dose of oral cytotec Labor: no Fetal Wellbeing:  Category I Pain Control:  Labor support without medications Anticipated MOD:  NSVD  CRESENZO-DISHMAN,Shantel Helwig 10/30/2014, 4:06 AM

## 2014-10-30 NOTE — Plan of Care (Signed)
Problem: Phase I Progression Outcomes Goal: Tolerating diet Outcome: Completed/Met Date Met:  10/30/14

## 2014-10-30 NOTE — Plan of Care (Signed)
Problem: Consults Goal: Orientation to unit: Other (Specify with a note) Outcome: Completed/Met Date Met:  10/30/14

## 2014-10-30 NOTE — Plan of Care (Signed)
Problem: Consults Goal: Automotive engineer Patient Education (See Patient Education module for education specifics.)  Outcome: Completed/Met Date Met:  10/30/14

## 2014-10-30 NOTE — Progress Notes (Signed)
Patient stated she felt she had a seizure coming on. Sat quietly with patient for several minutes. Patient stated the feeling passed and she did not believe she had a seizure. She did not appear to have a seizure. Fetal heart was reactive with no decels during this period. Patient did not have a decrease in LOC. Patient stated she felt tired, encouraged pt to lie down and try to get rest.

## 2014-10-30 NOTE — Plan of Care (Signed)
Problem: Consults Goal: Orientation to unit: Room Outcome: Completed/Met Date Met:  10/30/14

## 2014-10-30 NOTE — Plan of Care (Signed)
Problem: Consults Goal: Orientation to unit: [place] (smoking, visitation, chaplain services, helpline)  Outcome: Completed/Met Date Met:  10/30/14

## 2014-10-30 NOTE — Plan of Care (Signed)
Problem: Phase I Progression Outcomes Goal: OOB as tolerated unless otherwise ordered Outcome: Completed/Met Date Met:  10/30/14     

## 2014-10-30 NOTE — Plan of Care (Signed)
Problem: Phase I Progression Outcomes Goal: Induction meds as ordered Outcome: Completed/Met Date Met:  10/30/14

## 2014-10-30 NOTE — Plan of Care (Signed)
Problem: Phase I Progression Outcomes Goal: Obtain and review prenatal records Outcome: Completed/Met Date Met:  10/30/14

## 2014-10-30 NOTE — Plan of Care (Signed)
Problem: Consults Goal: Orientation to unit: Plan of Care Outcome: Completed/Met Date Met:  10/30/14

## 2014-10-30 NOTE — Progress Notes (Signed)
Kristy Rodgers is a 28 y.o. G1P0000 at 7245w0d   Subjective: Rec'd cytotec in the early AM. Now ready for Pitocin again.  Objective: BP 106/50 mmHg  Pulse 100  Temp(Src) 98.2 F (36.8 C) (Oral)  Resp 20  Ht 5\' 2"  (1.575 m)  Wt 114.306 kg (252 lb)  BMI 46.08 kg/m2  LMP 01/16/2014      FHT:  FHR: 150s bpm, variability: moderate,  accelerations:  Present,  decelerations:  Absent UC:   irreg SVE:   Dilation: 4 Effacement (%): Thick Station: Ballotable Exam by:: Shaw,CNM  Labs: Lab Results  Component Value Date   WBC 8.5 10/29/2014   HGB 11.6* 10/29/2014   HCT 34.5* 10/29/2014   MCV 88.7 10/29/2014   PLT 201 10/29/2014    Assessment / Plan: IOL process Cx semi-favorable  Will begin second round of Pitocin infusion Rev'd with family the lengthy, normal process of induction  SHAW, KIMBERLY CNM 10/30/2014, 10:43 AM

## 2014-10-30 NOTE — Plan of Care (Signed)
Problem: Phase I Progression Outcomes Goal: Assess per MD/Nurse,Routine-VS,FHR,UC,Head to Toe assess Outcome: Completed/Met Date Met:  10/30/14

## 2014-10-30 NOTE — Progress Notes (Signed)
Kristy Rodgers is a 28 y.o. G1P0000 at 1870w0d   Subjective: Frustrated with IOL process; has been on Pitocin for the better part of today; EFW 7+6 @ 37wks  Objective: BP 119/58 mmHg  Pulse 84  Temp(Src) 98.5 F (36.9 C) (Oral)  Resp 18  Ht 5\' 2"  (1.575 m)  Wt 114.306 kg (252 lb)  BMI 46.08 kg/m2  LMP 01/16/2014      FHT:  FHR: 150s bpm, variability: moderate,  accelerations:  Present,  decelerations:  Absent- occ mi variables UC:   regular, every 3 minutes with Pit @ 8412mu/min SVE:   Dilation: 4 Effacement (%): Thick Station: Ballotable Exam by:: Suda Forbess,CNM- ext os 4  Labs: Lab Results  Component Value Date   WBC 8.5 10/29/2014   HGB 11.6* 10/29/2014   HCT 34.5* 10/29/2014   MCV 88.7 10/29/2014   PLT 201 10/29/2014    Assessment / Plan: IOL process Semi-favorable cx  Will give a break from Pitocin, allow to eat, and do a dose or two of vag cytotec  Cam HaiSHAW, Achol Azpeitia CNM 10/30/2014, 5:57 PM

## 2014-10-30 NOTE — Plan of Care (Signed)
Problem: Phase I Progression Outcomes Goal: Pitocin as ordered Outcome: Completed/Met Date Met:  10/30/14

## 2014-10-31 ENCOUNTER — Inpatient Hospital Stay (HOSPITAL_COMMUNITY): Payer: 59 | Admitting: Anesthesiology

## 2014-10-31 MED ORDER — FENTANYL 2.5 MCG/ML BUPIVACAINE 1/10 % EPIDURAL INFUSION (WH - ANES)
INTRAMUSCULAR | Status: DC | PRN
  Administered 2014-10-31: 12 mL/h via EPIDURAL

## 2014-10-31 MED ORDER — PHENYLEPHRINE 40 MCG/ML (10ML) SYRINGE FOR IV PUSH (FOR BLOOD PRESSURE SUPPORT)
80.0000 ug | PREFILLED_SYRINGE | INTRAVENOUS | Status: DC | PRN
  Filled 2014-10-31: qty 2

## 2014-10-31 MED ORDER — FENTANYL 2.5 MCG/ML BUPIVACAINE 1/10 % EPIDURAL INFUSION (WH - ANES)
14.0000 mL/h | INTRAMUSCULAR | Status: DC | PRN
  Administered 2014-11-01: 14 mL/h via EPIDURAL
  Filled 2014-10-31 (×2): qty 125

## 2014-10-31 MED ORDER — INFLUENZA VAC SPLIT QUAD 0.5 ML IM SUSY: 0.5000 mL | PREFILLED_SYRINGE | INTRAMUSCULAR | Status: DC

## 2014-10-31 MED ORDER — DIPHENHYDRAMINE HCL 50 MG/ML IJ SOLN: 12.5000 mg | INTRAMUSCULAR | Status: DC | PRN

## 2014-10-31 MED ORDER — FENTANYL CITRATE 0.05 MG/ML IJ SOLN
100.0000 ug | INTRAMUSCULAR | Status: DC | PRN
  Administered 2014-10-31: 100 ug via INTRAVENOUS
  Filled 2014-10-31: qty 2

## 2014-10-31 MED ORDER — LACTATED RINGERS IV SOLN
500.0000 mL | Freq: Once | INTRAVENOUS | Status: AC
  Administered 2014-10-31: 500 mL via INTRAVENOUS

## 2014-10-31 MED ORDER — LACTATED RINGERS IV SOLN
INTRAVENOUS | Status: DC
  Administered 2014-10-31: 18:00:00 via INTRAUTERINE

## 2014-10-31 MED ORDER — EPHEDRINE 5 MG/ML INJ
10.0000 mg | INTRAVENOUS | Status: DC | PRN
  Filled 2014-10-31: qty 2

## 2014-10-31 MED ORDER — LIDOCAINE-EPINEPHRINE (PF) 2 %-1:200000 IJ SOLN
INTRAMUSCULAR | Status: DC | PRN
  Administered 2014-10-31 (×2): 3 mL

## 2014-10-31 MED ORDER — BUPIVACAINE HCL (PF) 0.25 % IJ SOLN
INTRAMUSCULAR | Status: DC | PRN
  Administered 2014-10-31 (×2): 4 mL via EPIDURAL

## 2014-10-31 MED ORDER — PHENYLEPHRINE 40 MCG/ML (10ML) SYRINGE FOR IV PUSH (FOR BLOOD PRESSURE SUPPORT)
80.0000 ug | PREFILLED_SYRINGE | INTRAVENOUS | Status: DC | PRN
  Filled 2014-10-31: qty 2
  Filled 2014-10-31: qty 10

## 2014-10-31 NOTE — Progress Notes (Signed)
Patient ID: Kristy Rodgers, female   DOB: October 15, 1986, 28 y.o.   MRN: [id number] Frustrated with long induction  Wants to go home. Discussed per Dr Macon LargeAnyanwu, we do not recommend this.   Filed Vitals:   10/31/14 0155 10/31/14 0635 10/31/14 0824 10/31/14 0912  BP: 96/45 94/54 105/64   Pulse: 79 85 80   Temp: 98.2 F (36.8 C) 98.2 F (36.8 C) 98.3 F (36.8 C)   TempSrc: Oral Oral Oral   Resp: 20 20 20 20   Height:      Weight:       Dilation: 4.5 Effacement (%): Thick Cervical Position: Posterior Station: -3 Presentation: Vertex Exam by:: B.Cagna,RN  FHR stable.  Irregular contractions  Discussed replacing Foley bulb and restarting Pitocin. Will have her get out of bed and go walk around for a while first.  Will reevaluate plan as the day progresses.

## 2014-10-31 NOTE — Progress Notes (Signed)
Eben Burownna Pauline Zeien is a 28 y.o. G1P0000 at 5670w1d admitted for induction of labor due to postdates.  Subjective: Pt comfortable with epidural.  Husband in room for support.    Objective: BP 100/72 mmHg  Pulse 89  Temp(Src) 98.5 F (36.9 C) (Oral)  Resp 20  Ht 5\' 2"  (1.575 m)  Wt 114.306 kg (252 lb)  BMI 46.08 kg/m2  SpO2 83%  LMP 01/16/2014      FHT:  FHR: 135 bpm, variability: moderate,  accelerations:  Present,  decelerations:  Present lates x 2 UC:   regular, every 7-10 minutes, MVUs 75 SVE:   Dilation: 4.5 Effacement (%): 100 Station: -2 Exam by:: B.Cagna,RN  Labs: Lab Results  Component Value Date   WBC 8.5 10/29/2014   HGB 11.6* 10/29/2014   HCT 34.5* 10/29/2014   MCV 88.7 10/29/2014   PLT 201 10/29/2014    Assessment / Plan: Induction of labor due to postdates Pitocin off r/t recurrent lates on FHR tracing  Labor: Progressing normally and plan to restart Pitocin at this time Preeclampsia:  n/a Fetal Wellbeing:  Overall category I with period of lates x 2 that was not recurrent Pain Control:  Epidural I/D:  n/a Anticipated MOD:  NSVD  LEFTWICH-KIRBY, Cari Burgo 10/31/2014, 9:10 PM

## 2014-10-31 NOTE — Progress Notes (Signed)
Kristy Rodgers is a 28 y.o. G1P0000 at 4653w1d admitted for induction of labor due to postdates.  Subjective: Pt comfortable with epidural.  Resting in bed with husband in room for support.  Objective: BP 93/48 mmHg  Pulse 71  Temp(Src) 98.4 F (36.9 C) (Oral)  Resp 20  Ht 5\' 2"  (1.575 m)  Wt 114.306 kg (252 lb)  BMI 46.08 kg/m2  SpO2 83%  LMP 01/16/2014      FHT:  FHR: 135 bpm, variability: moderate,  accelerations:  Abscent,  decelerations:  Present periodic variables with contractions, lasting 20-30 sec down to 90s UC:   regular, every 3 minutes SVE:   Dilation: 7 Effacement (%): 100 Station: -1, -2 Exam by:: B.Cagna,RN   Labs: Lab Results  Component Value Date   WBC 8.5 10/29/2014   HGB 11.6* 10/29/2014   HCT 34.5* 10/29/2014   MCV 88.7 10/29/2014   PLT 201 10/29/2014    Assessment / Plan: Induction of labor due to postdates,  progressing well on pitocin  Labor: Progressing normally Preeclampsia:  n/a Fetal Wellbeing:  Category II Pain Control:  Epidural I/D:  n/a Anticipated MOD:  NSVD  LEFTWICH-KIRBY, Hanish Laraia 10/31/2014, 11:23 PM

## 2014-10-31 NOTE — Plan of Care (Signed)
Problem: Phase I Progression Outcomes Goal: FHR checked 5 minutes after meds (ROM) Rupture of Membranes Outcome: Completed/Met Date Met:  10/31/14 Goal: Discharge home if all goals are met Outcome: Not Applicable Date Met:  82/50/53 Goal: Medical plan of care initiated within 2 hrs of admission Outcome: Completed/Met Date Met:  10/31/14 Goal: Other Phase I Outcomes/Goals Outcome: Not Applicable Date Met:  97/67/34

## 2014-10-31 NOTE — Progress Notes (Signed)
Patient ID: Kristy Rodgers, female   DOB: 1985/12/21, 28 y.o.   MRN: [id number]  Standing at bedside now, hurting more  Filed Vitals:   10/31/14 1542 10/31/14 1605 10/31/14 1630 10/31/14 1703  BP: 106/53 114/68  116/66  Pulse: 65 71  73  Temp: 98.1 F (36.7 C)     TempSrc: Oral     Resp: 18 18 18 18   Height:      Weight:       FHR stable UCs every 2-3 min  Dilation: 3.5 Effacement (%): 70 Cervical Position: Posterior Station: -2 Presentation: Vertex Exam by:: L Lamon RN   Continue to observe

## 2014-10-31 NOTE — Progress Notes (Signed)
Pt sitting for epidural - blood return - will need to redo epidural

## 2014-10-31 NOTE — Anesthesia Procedure Notes (Signed)
Epidural Patient location during procedure: OB  Staffing Anesthesiologist: Mikayla Chiusano, CHRIS Performed by: anesthesiologist   Preanesthetic Checklist Completed: patient identified, surgical consent, pre-op evaluation, timeout performed, IV checked, risks and benefits discussed and monitors and equipment checked  Epidural Patient position: sitting Prep: site prepped and draped and DuraPrep Patient monitoring: heart rate, cardiac monitor, continuous pulse ox and blood pressure Approach: midline Location: L3-L4 Injection technique: LOR saline  Needle:  Needle type: Tuohy  Needle gauge: 17 G Needle length: 9 cm Catheter type: closed end flexible Catheter size: 19 Gauge Test dose: 2% lidocaine with Epi 1:200 K and negative  Assessment Events: blood not aspirated, injection not painful, no injection resistance, negative IV test and no paresthesia  Additional Notes H+P and labs checked, risks and benefits discussed with the patient, consent obtained. Initial epidural placed at L4/5 with neg aspiration however test with 2% lido with epi was positive and heme was aspirated post test. Repeat at L4/5 was placed after 5 ml of saline to dilate epidural space. Aspiration of fluid via catheter without swirl when mixing with local anesthesia. Indeterminate if intrathecal or epidural catheter. Catheter removed and replaced at L3/4 without complication. Patient notified of heme catheter and potential dural puncture. Will follow for PDPH. Reason for block:procedure for pain

## 2014-10-31 NOTE — Progress Notes (Signed)
Artelia LarocheM Williams CNM reviewed strip and gave verbal orders that pt can come off of monitors for ambulation.  CNM to put in intermittent monitoring orders.

## 2014-10-31 NOTE — Progress Notes (Signed)
Patient ID: Kristy Rodgers Pauline Chern, female   DOB: January 08, 1986, 28 y.o.   MRN: [id number]  Rechecked cervix.  Cervix 4cm/70/-3/ballotable/ compound hand palpated. Membranes swept.   Will restart Pitocin

## 2014-10-31 NOTE — Plan of Care (Signed)
Problem: Phase I Progression Outcomes Goal: IV Pain medications as ordered Outcome: Completed/Met Date Met:  10/31/14

## 2014-10-31 NOTE — Progress Notes (Signed)
Patient ID: Kristy Rodgers, female   DOB: [birth year], 28 y.o.   MRN: [id number]   SROM about 1330hrs Clear fluid  Filed Vitals:   10/31/14 1337 10/31/14 1403 10/31/14 1433 10/31/14 1502  BP: 118/61 117/78 88/51 108/66  Pulse: 79 84 76 65  Temp:      TempSrc:      Resp: 18 18 18    Height:      Weight:       FHR stable and reassuring UCs every 2-5 min  Dilation: 3.5 Effacement (%): 70 Cervical Position: Posterior Station: -2 Presentation: Vertex Exam by:: L Lamon RN  Will anticipate increased labor now.

## 2014-10-31 NOTE — Anesthesia Preprocedure Evaluation (Signed)
Anesthesia Evaluation  Patient identified by MRN, date of birth, ID band  Reviewed: Allergy & Precautions, H&P , NPO status , Patient's Chart, lab work & pertinent test results  History of Anesthesia Complications Negative for: history of anesthetic complications  Airway Mallampati: III  TM Distance: >3 FB Neck ROM: Full    Dental  (+) Teeth Intact   Pulmonary neg pulmonary ROS,  breath sounds clear to auscultation        Cardiovascular negative cardio ROS  Rhythm:Regular     Neuro/Psych  Headaches, Seizures -,  PSYCHIATRIC DISORDERS Anxiety    GI/Hepatic negative GI ROS, Neg liver ROS,   Endo/Other  Hypothyroidism Morbid obesity  Renal/GU      Musculoskeletal   Abdominal   Peds  Hematology  (+) anemia ,   Anesthesia Other Findings   Reproductive/Obstetrics (+) Pregnancy                             Anesthesia Physical Anesthesia Plan  ASA: III  Anesthesia Plan: Epidural   Post-op Pain Management:    Induction:   Airway Management Planned:   Additional Equipment:   Intra-op Plan:   Post-operative Plan:   Informed Consent: I have reviewed the patients History and Physical, chart, labs and discussed the procedure including the risks, benefits and alternatives for the proposed anesthesia with the patient or authorized representative who has indicated his/her understanding and acceptance.   Dental advisory given  Plan Discussed with: Anesthesiologist  Anesthesia Plan Comments:         Anesthesia Quick Evaluation

## 2014-10-31 NOTE — Progress Notes (Addendum)
Patient ID: Kristy Rodgers, female   DOB: 04-May-1986, 28 y.o.   MRN: [id number]  Developed some late-timed variable appearing decels with UCs after receiving Fentanyl  IUPC and ISE placed. UCs average 200-22905mvu/10min  + acceleration with initiation of amnioinfusion  Now decels improved, though still has a few subtle late decels  Cervix 4/100/-2  Will amnioinfuse and observe

## 2014-11-01 ENCOUNTER — Encounter (HOSPITAL_COMMUNITY): Payer: Self-pay

## 2014-11-01 DIAGNOSIS — O480 Post-term pregnancy: Secondary | ICD-10-CM

## 2014-11-01 MED ORDER — DIBUCAINE 1 % RE OINT: 1.0000 "application " | TOPICAL_OINTMENT | RECTAL | Status: DC | PRN

## 2014-11-01 MED ORDER — TETANUS-DIPHTH-ACELL PERTUSSIS 5-2.5-18.5 LF-MCG/0.5 IM SUSP: 0.5000 mL | Freq: Once | INTRAMUSCULAR | Status: DC

## 2014-11-01 MED ORDER — BENZOCAINE-MENTHOL 20-0.5 % EX AERO
1.0000 "application " | INHALATION_SPRAY | CUTANEOUS | Status: DC | PRN
  Administered 2014-11-01: 1 via TOPICAL
  Filled 2014-11-01: qty 56

## 2014-11-01 MED ORDER — ONDANSETRON HCL 4 MG PO TABS: 4.0000 mg | ORAL_TABLET | ORAL | Status: DC | PRN

## 2014-11-01 MED ORDER — WITCH HAZEL-GLYCERIN EX PADS: 1.0000 "application " | MEDICATED_PAD | CUTANEOUS | Status: DC | PRN

## 2014-11-01 MED ORDER — SIMETHICONE 80 MG PO CHEW: 80.0000 mg | CHEWABLE_TABLET | ORAL | Status: DC | PRN

## 2014-11-01 MED ORDER — DIPHENHYDRAMINE HCL 25 MG PO CAPS: 25.0000 mg | ORAL_CAPSULE | Freq: Four times a day (QID) | ORAL | Status: DC | PRN

## 2014-11-01 MED ORDER — IBUPROFEN 600 MG PO TABS
600.0000 mg | ORAL_TABLET | Freq: Four times a day (QID) | ORAL | Status: DC
  Administered 2014-11-01 – 2014-11-03 (×8): 600 mg via ORAL
  Filled 2014-11-01 (×8): qty 1

## 2014-11-01 MED ORDER — SENNOSIDES-DOCUSATE SODIUM 8.6-50 MG PO TABS
2.0000 | ORAL_TABLET | ORAL | Status: DC
  Administered 2014-11-01: 2 via ORAL
  Filled 2014-11-01 (×2): qty 2

## 2014-11-01 MED ORDER — ONDANSETRON HCL 4 MG/2ML IJ SOLN: 4.0000 mg | INTRAMUSCULAR | Status: DC | PRN

## 2014-11-01 MED ORDER — OXYCODONE-ACETAMINOPHEN 5-325 MG PO TABS
2.0000 | ORAL_TABLET | ORAL | Status: DC | PRN
Start: 2014-11-01 — End: 2014-11-03

## 2014-11-01 MED ORDER — PRENATAL MULTIVITAMIN CH
1.0000 | ORAL_TABLET | Freq: Every day | ORAL | Status: DC
  Administered 2014-11-01 – 2014-11-02 (×2): 1 via ORAL
  Filled 2014-11-01 (×2): qty 1

## 2014-11-01 MED ORDER — LANOLIN HYDROUS EX OINT: TOPICAL_OINTMENT | CUTANEOUS | Status: DC | PRN

## 2014-11-01 MED ORDER — LAMOTRIGINE 200 MG PO TABS
200.0000 mg | ORAL_TABLET | Freq: Every day | ORAL | Status: DC
  Administered 2014-11-02 – 2014-11-03 (×2): 200 mg via ORAL
  Filled 2014-11-01 (×2): qty 1

## 2014-11-01 MED ORDER — ZOLPIDEM TARTRATE 5 MG PO TABS: 5.0000 mg | ORAL_TABLET | Freq: Every evening | ORAL | Status: DC | PRN

## 2014-11-01 MED ORDER — OXYCODONE-ACETAMINOPHEN 5-325 MG PO TABS: 1.0000 | ORAL_TABLET | ORAL | Status: DC | PRN

## 2014-11-01 NOTE — Anesthesia Postprocedure Evaluation (Signed)
  Anesthesia Post-op Note  Patient: Kristy Rodgers [name]  Procedure(s) Performed: * No procedures listed *  Patient Location: Mother/Baby  Anesthesia Type:Epidural  Level of Consciousness: awake  Airway and Oxygen Therapy: Patient Spontanous Breathing  Post-op Pain: mild  Post-op Assessment: Patient's Cardiovascular Status Stable and Respiratory Function Stable  Post-op Vital Signs: stable  Last Vitals:  Filed Vitals:   11/01/14 1344  BP: 107/75  Pulse: 99  Temp: 37.2 C  Resp: 18    Complications: No apparent anesthesia complications

## 2014-11-01 NOTE — Lactation Note (Signed)
This note was copied from the chart of Kristy Rodgers Peruski. Lactation Consultation Note  Patient Name: Kristy Rodgers Hayse [id number]'s Date: 11/01/2014 Reason for consult: Initial assessment of this mom and baby, now 17 hours postpartum.  Mom verbalizes anxiety about whether baby is getting any milk.  LC demonstrated hand expression and copious flow of colostrum drops seen.  Baby was able to latch for 7 minutes on (R) in football position and mom was shown how to support breast and ensure deep latch and effective sucking bursts.  LC discussed cue feedings and possible cluster feedings, signs of proper latch, STS and positioning.Mom encouraged to feed baby 8-12 times/24 hours and with feeding cues. LC encouraged review of Baby and Me pp 9, 14 and 20-25 for STS and BF information. LC provided [employer] Resource brochure and [facility] and list of community and web site resources.      Maternal Data Formula Feeding for Exclusion: No Has patient been taught Hand Expression?: Yes (LC demonstrated and copious colostrum drops seen) Does the patient have breastfeeding experience prior to this delivery?: No  Feeding Feeding Type: Breast Fed Length of feed: 7 min  LATCH Score/Interventions Latch: Grasps breast easily, tongue down, lips flanged, rhythmical sucking.  Audible Swallowing: Spontaneous and intermittent  Type of Nipple: Everted at rest and after stimulation (large/soft breasts)  Comfort (Breast/Nipple): Soft / non-tender     Hold (Positioning): Assistance needed to correctly position infant at breast and maintain latch. Intervention(s): Breastfeeding basics reviewed;Support Pillows;Position options;Skin to skin (discussed benefits of football or across-the-lap positins w/large breasts)  LATCH Score: 9 (LC assisted and observed)  Lactation Tools Discussed/Used   STS, cue feedings, hand expression Positioning and breast support Signs of proper latch and milk transfer  Consult  Status Consult Status: Follow-up Date: 11/02/14 Follow-up type: In-patient    Warrick ParisianBryant, Jerric Oyen [facility] 11/01/2014, 10:41 PM

## 2014-11-01 NOTE — Plan of Care (Signed)
Problem: Phase I Progression Outcomes Goal: Pain controlled with appropriate interventions Outcome: Completed/Met Date Met:  11/01/14 Goal: OOB as tolerated unless otherwise ordered Outcome: Completed/Met Date Met:  11/01/14 Goal: VS, stable, temp < 100.4 degrees F Outcome: Completed/Met Date Met:  11/01/14 Goal: Other Phase I Outcomes/Goals Outcome: Not Applicable Date Met:  94/99/71  Problem: Phase II Progression Outcomes Goal: Pain controlled on oral analgesia Outcome: Completed/Met Date Met:  11/01/14 Goal: Progress activity as tolerated unless otherwise ordered Outcome: Completed/Met Date Met:  11/01/14 Goal: Afebrile, VS remain stable Outcome: Completed/Met Date Met:  11/01/14 Goal: Incision intact & without signs/symptoms of infection Outcome: Not Applicable Date Met:  82/09/90 Goal: Rh isoimmunization per orders Outcome: Not Applicable Date Met:  68/93/40 Goal: Tolerating diet Outcome: Completed/Met Date Met:  11/01/14 Goal: Other Phase II Outcomes/Goals Outcome: Not Applicable Date Met:  68/40/33

## 2014-11-01 NOTE — Progress Notes (Signed)
Kristy Rodgers is a 28 y.o. G1P0000 at 3313w2d admitted for induction of labor due to postdates.  Subjective: Pt comfortable with epidural.  Sitting up in bed, husband in room for support.  Objective: BP 101/59 mmHg  Pulse 75  Temp(Src) 98.4 F (36.9 C) (Oral)  Resp 20  Ht 5\' 2"  (1.575 m)  Wt 114.306 kg (252 lb)  BMI 46.08 kg/m2  SpO2 83%  LMP 01/16/2014      FHT:  FHR: 140 bpm, variability: moderate,  accelerations:  Abscent,  decelerations:  Present periodic variables, occasional lates, not repetitive UC:   regular, every 2-3 minutes SVE:   Dilation: Lip/rim Effacement (%): 100 Station: 0 Exam by:: B.Cagna,RN  Labs: Lab Results  Component Value Date   WBC 8.5 10/29/2014   HGB 11.6* 10/29/2014   HCT 34.5* 10/29/2014   MCV 88.7 10/29/2014   PLT 201 10/29/2014    Assessment / Plan: Induction of labor due to postdates,  progressing well on pitocin  Labor: Progressing normally Preeclampsia:  n/a Fetal Wellbeing:  Category II Pain Control:  Epidural I/D:  n/a Anticipated MOD:  NSVD  LEFTWICH-KIRBY, Auda Finfrock 11/01/2014, 2:14 AM

## 2014-11-01 NOTE — Plan of Care (Signed)
Problem: Phase I Progression Outcomes Goal: Voiding adequately Outcome: Completed/Met Date Met:  11/01/14     

## 2014-11-01 NOTE — Plan of Care (Signed)
Problem: Phase I Progression Outcomes Goal: Initial discharge plan identified Outcome: Completed/Met Date Met:  11/01/14     

## 2014-11-02 NOTE — Progress Notes (Signed)
Post Partum Day 1 Subjective: Subjective: No problems or concerns. Patient states doing well. Reports minimal bleeding and pain controlled with pain medications. Denies calf pain.  Breastfeeding.  Desires condoms for birth control.  Objective: Blood pressure 107/58, pulse 71, temperature 98.2 F (36.8 C), temperature source Oral, resp. rate 18, height 5\' 2"  (1.575 m), weight 114.306 kg (252 lb), last menstrual period 01/16/2014, SpO2 100 %, unknown if currently breastfeeding.  Physical Exam:  Filed Vitals:   11/02/14 0655  BP: 107/58  Pulse: 71  Temp: 98.2 F (36.8 C)  Resp: 18    General: alert, cooperative and appears stated age Lochia: appropriate Uterine Fundus: firm Incision: n/a DVT Evaluation: No evidence of DVT seen on physical exam. Negative Homan's sign. No results for input(s): HGB, HCT in the last 72 hours.  Assessment/Plan: Plan for discharge tomorrow, Breastfeeding and Lactation consult   LOS: 4 days   Marlis EdelsonKARIM, Jerod Mcquain N 11/02/2014, 7:13 AM

## 2014-11-02 NOTE — Plan of Care (Signed)
Problem: Discharge Progression Outcomes Goal: Tolerating diet Outcome: Completed/Met Date Met:  11/02/14 Goal: Pain controlled with appropriate interventions Outcome: Completed/Met Date Met:  11/02/14 Goal: Afebrile, VS remain stable at discharge Outcome: Completed/Met Date Met:  11/02/14

## 2014-11-02 NOTE — Lactation Note (Signed)
This note was copied from the chart of Kristy Weyman Rodneynna Mcfaul. Lactation Consultation Note C/o stated baby extremely fussy, wanting to stay on breast constantly, acting like she was starving. Having several poops and 2 voids since birth. Noted tubular breast w/space between breast. Breast tissue soft and flopy, massaged breast to get colostrum out. Had to work and and work to get it out. Gave in syring.  Mom has PCOS and hypothroidism. No meds. Encouraged to post-pump after each feeding. SNS w/similac d/t unable to get enough colostrum to satisfy baby. Baby doesn't obtain a tight seal of lips around mouth. #20 NS fitted to Rt. Nipple, gave #24 may be for the Lt. Nipple, slightly larger. Mom taught how to apply & clean nipple shield. Mom shown how to use DEBP & how to disassemble, clean, & reassemble parts.baby has high palate but will not keep a tight seal to corners fo month. Worked with mom and baby on poistionsing, Patient Name: Kristy Rodgers ZOXWR'UToday's Date: 11/02/2014 Reason for consult: Follow-up assessment;Other (Comment) (cluster feeding, fussy baby)   Maternal Data    Feeding Feeding Type: Breast Milk with Formula added Length of feed: 30 min  LATCH Score/Interventions Latch: Grasps breast easily, tongue down, lips flanged, rhythmical sucking.  Audible Swallowing: Spontaneous and intermittent Intervention(s): Hand expression  Type of Nipple: Everted at rest and after stimulation  Comfort (Breast/Nipple): Filling, red/small blisters or bruises, mild/mod discomfort  Problem noted: Mild/Moderate discomfort Interventions (Mild/moderate discomfort): Comfort gels;Post-pump;Hand expression;Hand massage;Breast shields  Hold (Positioning): Assistance needed to correctly position infant at breast and maintain latch. Intervention(s): Skin to skin;Position options;Support Pillows;Breastfeeding basics reviewed  LATCH Score: 8  Lactation Tools Discussed/Used Tools: Nipple  Shields;Pump;Supplemental Nutrition System;Comfort gels Nipple shield size: 20;24 Breast pump type: Double-Electric Breast Pump Pump Review: Setup, frequency, and cleaning;Milk Storage Initiated by:: Peri JeffersonL. Chesnee Floren RN Date initiated:: 11/02/14   Consult Status Consult Status: Follow-up Date: 11/02/14 Follow-up type: In-patient    Kimmie Doren, Diamond NickelLAURA G 11/02/2014, 5:30 AM

## 2014-11-02 NOTE — Lactation Note (Signed)
This note was copied from the chart of Kristy Weyman Rodneynna Bangs. Lactation Consultation Note Follow up visit at 40 hours of age, [facility] reports mom has started pumping, but previously been over whelmed with breastfeeding assist.   After lengthy conversation of Lamictal medicine and breastfeeding and why baby is in NICU related to inconsolable behavior and offered encouragement to pump for baby, mom has decided to discontinue breastfeeding attempts including pumping.  Answered questions and mom know she can call if she has questions.    Patient Name: Kristy Weyman Rodneynna Umholtz WUJWJ'XToday's Date: 11/02/2014 Reason for consult: Follow-up assessment   Maternal Data    Feeding    LATCH Score/Interventions                      Lactation Tools Discussed/Used     Consult Status Consult Status: Complete    Kristy Rodgers, Kristy Rodgers 11/02/2014, 9:19 PM

## 2014-11-02 NOTE — Progress Notes (Deleted)
Pt NPO for BTS with epidural in place. Requests BTS but during conversation she hesitates often and looks at husband. Husband states this her baseline, to pause. Pt has consistently been unwilling to completely commit to BTS. - no BTS at this time - f/u 2 wk postpartum for contraception counseling, if still desires BTS at that time may schedule - pt overall happy with plan, agreeable to waiting as she currently is wavering on BTS.  Olyn Landstrom ROCIO, MD 10:25 AM  

## 2014-11-02 NOTE — Progress Notes (Addendum)
CSW attempted to meet with the MOB and the FOB in order to introduce self, role of CSW in NICU team, and to provide emotional support due to NICU admission.   MOB was resting.  CSW introduced self to FOB.  CSW offered to return at a later time once MOB is awake. FOB agreeable and expressed appreciation.   CSW will make second attempt today (11/23).   CSW made second attempt, MOB continued to be resting.  CSW will continue to make attempts on 11/24.

## 2014-11-03 MED ORDER — IBUPROFEN 600 MG PO TABS: 600.0000 mg | ORAL_TABLET | ORAL | Status: AC | PRN

## 2014-11-03 NOTE — Discharge Instructions (Signed)

## 2014-11-03 NOTE — Discharge Summary (Signed)
Obstetric Discharge Summary Reason for Admission: induction of labor Prenatal Procedures: none Intrapartum Procedures: spontaneous vaginal delivery Postpartum Procedures: none Complications-Operative and Postpartum: none HEMOGLOBIN  Date Value Ref Range Status  10/29/2014 11.6* 12.0 - 15.0 g/dL Final   HCT  Date Value Ref Range Status  10/29/2014 34.5* 36.0 - 46.0 % Final   Hospital Course: 28 y.o. G1P0000 @[redacted]w[redacted]d  admitted 11/19 for IOL for postdates. After lengthy induction with Cytotec and foley bulb, pt progressed on Pitocin on 10/31/14 from 4 cm and thick following foley catheter removal to 4cm and 100% effaced, then progressed well to 10 cm within a few hours. She pushed less than 1 hour to deliver, with nuchal cord x 1 [facility].No [facility] complications.  On PPD#2 mother doing well and discharged home.    Physical Exam:  General: alert, cooperative and no distress Lochia: appropriate Uterine Fundus: firm DVT Evaluation: No evidence of DVT seen on physical exam.  Discharge Diagnoses: Post-date pregnancy  Discharge Information: Date: 11/03/2014 Activity: pelvic rest Diet: routine Medications: Ibuprofen Condition: stable Instructions: refer to practice specific booklet Discharge to: home   Newborn Data: Live born female  Birth Weight: 6 lb 10.4 oz (3016 g) APGAR: 8, 9  Currently in NICU, possible discharge with mother.  Shirlee LatchBacigalupo, Angela 11/03/2014, 7:36 AM

## 2014-11-03 NOTE — Plan of Care (Signed)
Problem: Discharge Progression Outcomes Goal: Barriers To Progression Addressed/Resolved Outcome: Completed/Met Date Met:  11/03/14 Goal: Activity appropriate for discharge plan Outcome: Completed/Met Date Met:  08/65/78 Goal: Complications resolved/controlled Outcome: Completed/Met Date Met:  11/03/14 Goal: Discharge plan in place and appropriate Outcome: Completed/Met Date Met:  11/03/14 Goal: Other Discharge Outcomes/Goals Outcome: Not Applicable Date Met:  46/96/29

## 2014-11-03 NOTE — Plan of Care (Signed)
Problem: Consults Goal: Postpartum Patient Education (See Patient Education module for education specifics.)  Outcome: Completed/Met Date Met:  11/03/14

## 2014-11-10 ENCOUNTER — Ambulatory Visit (INDEPENDENT_AMBULATORY_CARE_PROVIDER_SITE_OTHER): Payer: 59 | Admitting: Physician Assistant

## 2014-11-10 ENCOUNTER — Encounter: Payer: Self-pay | Admitting: Physician Assistant

## 2014-11-10 VITALS — BP 130/84 | HR 84 | Resp 16 | Wt 237.0 lb

## 2014-11-10 DIAGNOSIS — Z0130 Encounter for examination of blood pressure without abnormal findings: Secondary | ICD-10-CM

## 2014-11-10 DIAGNOSIS — R030 Elevated blood-pressure reading, without diagnosis of hypertension: Secondary | ICD-10-CM

## 2014-11-10 NOTE — Patient Instructions (Signed)

## 2014-11-10 NOTE — Progress Notes (Signed)
Patient ID: Kristy Rodgers, female   DOB: [birth year], 27 y.o.   MRN: [id number] History:  Kristy Rodgers is a 28 y.o. G1P1001 who presents to clinic today for elevated blood pressure.  She reports since having her baby just over a week ago that her blood pressure has been increased up to 160 systolic and her feet are very swollen.  She had lots of IV fluids during her induction and is not getting out of the house.   She denies headache, epigastric pain, vision changes, nausea, vomiting, fever.  She reports her blood pressure was better this morning, with systolic reading around 140.    The following portions of the patient's history were reviewed and updated as appropriate: allergies, current medications, past family history, past medical history, past social history, past surgical history and problem list.  Review of Systems:  Pertinent items are noted in HPI.  Objective:  Physical Exam BP 130/84 mmHg  Pulse 84  Resp 16  Wt 237 lb (107.502 kg)  LMP 01/16/2014  Breastfeeding? No GENERAL: Well-developed, well-nourished female in no acute distress.  HEENT: Normocephalic, atraumatic.  HEART: Regular rate  ABDOMEN: Soft, nontender, nondistended. No organomegaly. Normal bowel sounds appreciated in all quadrants.  EXTREMITIES: Edema 2+ bilat   Labs and Imaging None  Assessment & Plan:  Assessment: Blood pressure check - fine today  Plans: Monitor blood pressure at home  Encourage good hydration Increase activity level - walk outside around the block  RTC asap if bp elevated again.    Bertram DenverKaren E Teague Clark, PA-C 11/10/2014 4:12 PM

## 2014-12-14 ENCOUNTER — Ambulatory Visit (INDEPENDENT_AMBULATORY_CARE_PROVIDER_SITE_OTHER): Payer: 59 | Admitting: Advanced Practice Midwife

## 2014-12-14 ENCOUNTER — Encounter: Payer: Self-pay | Admitting: Advanced Practice Midwife

## 2014-12-14 DIAGNOSIS — N939 Abnormal uterine and vaginal bleeding, unspecified: Secondary | ICD-10-CM

## 2014-12-14 DIAGNOSIS — F53 Postpartum depression: Secondary | ICD-10-CM

## 2014-12-14 DIAGNOSIS — O99345 Other mental disorders complicating the puerperium: Secondary | ICD-10-CM

## 2014-12-14 MED ORDER — SERTRALINE HCL 50 MG PO TABS: 50.0000 mg | ORAL_TABLET | Freq: Every day | ORAL | Status: AC

## 2014-12-14 MED ORDER — METHYLERGONOVINE MALEATE 0.2 MG PO TABS: 0.2000 mg | ORAL_TABLET | Freq: Four times a day (QID) | ORAL | Status: DC

## 2014-12-15 ENCOUNTER — Telehealth: Payer: Self-pay | Admitting: *Deleted

## 2014-12-15 DIAGNOSIS — N939 Abnormal uterine and vaginal bleeding, unspecified: Secondary | ICD-10-CM

## 2014-12-15 LAB — CBC
HCT: 38.4 % (ref 36.0–46.0)
Hemoglobin: 12.8 g/dL (ref 12.0–15.0)
MCH: 28.6 pg (ref 26.0–34.0)
MCHC: 33.3 g/dL (ref 30.0–36.0)
MCV: 85.7 fL (ref 78.0–100.0)
MPV: 10.4 fL (ref 8.6–12.4)
Platelets: 305 10*3/uL (ref 150–400)
RBC: 4.48 MIL/uL (ref 3.87–5.11)
RDW: 13.3 % (ref 11.5–15.5)
WBC: 6 10*3/uL (ref 4.0–10.5)

## 2014-12-15 NOTE — Telephone Encounter (Signed)
Pt notified of normal CBC and [facility] H[facility] per Kristy Rodgers, CNM  Pt plans on going this afternoon.

## 2014-12-16 ENCOUNTER — Telehealth: Payer: Self-pay | Admitting: *Deleted

## 2014-12-16 LAB — HCG, SERUM, QUALITATIVE: Preg, Serum: NEGATIVE

## 2014-12-16 NOTE — Patient Instructions (Signed)
Postpartum Depression and Baby Blues The postpartum period begins right after the birth of a baby. During this time, there is often a great amount of joy and excitement. It is also a time of many changes in the life of the parents. Regardless of how many times a mother gives birth, each child brings new challenges and dynamics to the family. It is not unusual to have feelings of excitement along with confusing shifts in moods, emotions, and thoughts. All mothers are at risk of developing postpartum depression or the "baby blues." These mood changes can occur right after giving birth, or they may occur many months after giving birth. The baby blues or postpartum depression can be mild or severe. Additionally, postpartum depression can go away rather quickly, or it can be a long-term condition.  CAUSES Raised hormone levels and the rapid drop in those levels are thought to be a main cause of postpartum depression and the baby blues. A number of hormones change during and after pregnancy. Estrogen and progesterone usually decrease right after the delivery of your baby. The levels of thyroid hormone and various cortisol steroids also rapidly drop. Other factors that play a role in these mood changes include major life events and genetics.  RISK FACTORS If you have any of the following risks for the baby blues or postpartum depression, know what symptoms to watch out for during the postpartum period. Risk factors that may increase the likelihood of getting the baby blues or postpartum depression include:  Having a personal or family history of depression.   Having depression while being pregnant.   Having premenstrual mood issues or mood issues related to oral contraceptives.  Having a lot of life stress.   Having marital conflict.   Lacking a social support network.   Having a baby with special needs.   Having health problems, such as diabetes.  SIGNS AND SYMPTOMS Symptoms of baby blues  include:  Brief changes in mood, such as going from extreme happiness to sadness.  Decreased concentration.   Difficulty sleeping.   Crying spells, tearfulness.   Irritability.   Anxiety.  Symptoms of postpartum depression typically begin within the first month after giving birth. These symptoms include:  Difficulty sleeping or excessive sleepiness.   Marked weight loss.   Agitation.   Feelings of worthlessness.   Lack of interest in activity or food.  Postpartum psychosis is a very serious condition and can be dangerous. Fortunately, it is rare. Displaying any of the following symptoms is cause for immediate medical attention. Symptoms of postpartum psychosis include:   Hallucinations and delusions.   Bizarre or disorganized behavior.   Confusion or disorientation.  DIAGNOSIS  A diagnosis is made by an evaluation of your symptoms. There are no medical or lab tests that lead to a diagnosis, but there are various questionnaires that a health care provider may use to identify those with the baby blues, postpartum depression, or psychosis. Often, a screening tool called the Edinburgh Postnatal Depression Scale is used to diagnose depression in the postpartum period.  TREATMENT The baby blues usually goes away on its own in 1-2 weeks. Social support is often all that is needed. You will be encouraged to get adequate sleep and rest. Occasionally, you may be given medicines to help you sleep.  Postpartum depression requires treatment because it can last several months or longer if it is not treated. Treatment may include individual or group therapy, medicine, or both to address any social, physiological, and psychological   factors that may play a role in the depression. Regular exercise, a healthy diet, rest, and social support may also be strongly recommended.  Postpartum psychosis is more serious and needs treatment right away. Hospitalization is often needed. HOME CARE  INSTRUCTIONS  Get as much rest as you can. Nap when the baby sleeps.   Exercise regularly. Some women find yoga and walking to be beneficial.   Eat a balanced and nourishing diet.   Do little things that you enjoy. Have a cup of tea, take a bubble bath, read your favorite magazine, or listen to your favorite music.  Avoid alcohol.   Ask for help with household chores, cooking, grocery shopping, or running errands as needed. Do not try to do everything.   Talk to people close to you about how you are feeling. Get support from your partner, family members, friends, or other new moms.  Try to stay positive in how you think. Think about the things you are grateful for.   Do not spend a lot of time alone.   Only take over-the-counter or prescription medicine as directed by your health care provider.  Keep all your postpartum appointments.   Let your health care provider know if you have any concerns.  SEEK MEDICAL CARE IF: You are having a reaction to or problems with your medicine. SEEK IMMEDIATE MEDICAL CARE IF:  You have suicidal feelings.   You think you may harm the baby or someone else. MAKE SURE YOU:  Understand these instructions.  Will watch your condition.  Will get help right away if you are not doing well or get worse. Document Released: 08/31/2004 Document Revised: 12/02/2013 Document Reviewed: 09/08/2013 ExitCare Patient Information 2015 [employer]. This information is not intended to replace advice given to you by your health care provider. Make sure you discuss any questions you have with your health care provider.  

## 2014-12-16 NOTE — Telephone Encounter (Signed)
Pt notified of neg test results

## 2014-12-16 NOTE — Progress Notes (Signed)
Subjective:     Kristy Rodgers is a 29 y.o. female who presents for a postpartum visit. She is 6 weeks postpartum following a spontaneous vaginal delivery. I have fully reviewed the prenatal and intrapartum course. The delivery was at 41.2 gestational weeks. Outcome: spontaneous vaginal delivery. No Lacerations. Anesthesia: epidural. Postpartum course has been complicated by anxiety, significant tension w/ spouse and continued moderate-heavy vaginal bleeding, although pt reports that it is light today. Pt states she is feeling overwhelmed, but has a hard time letting her mother and husband take care of the baby and household. States husband doesn't seem to know what to do with the baby so she takes over. She has considered asked him to leave. He is a truck-driver and has been gone a lot since the birth. Pt's mother has been available for help. Denies SI/HI.  Baby's course has been normal. Baby is feeding by bottle -  . Bleeding moderate lochia. Bowel function is normal. Bladder function is normal. Patient is not sexually active. Contraception method is abstinence. Postpartum depression screening: positive.  The following portions of the patient's history were reviewed and updated as appropriate: allergies, current medications, past family history, past medical history, past social history, past surgical history and problem list.  Review of Systems Neg for fever, chills, abd pain, passage of clots or tissue.   Objective:    BP 105/68 mmHg  Pulse 86  Resp 16  Ht 5\' 2"  (1.575 m)  Wt 229 lb (103.874 kg)  BMI 41.87 kg/m2  Breastfeeding? No  General:  alert, cooperative and anxious, sometimes tearful.   Breasts:  Declined exam  Lungs: clear to auscultation bilaterally  Heart:  regular rate and rhythm, S1, S2 normal, no murmur, click, rub or gallop  Abdomen: soft, non-tender; bowel sounds normal; no masses,  no organomegaly   Vulva:  not evaluated. Declined exam.   Vagina: not evaluated                    Assessment:   1. Abnormal uterine bleeding, postpartum   - methylergonovine (METHERGINE) 0.2 MG tablet; Take 1 tablet (0.2 mg total) by mouth 4 (four) times daily.  Dispense: 4 tablet; Refill: 0 - CBC  2. Postpartum exam    3. Postpartum depression   Plan:    1. Contraception: abstinence 2. Support given. Discussed delegating some responsibilities, good self care, resuming light exercise, sunlight exposure, sleeping when baby sleeps. ALso discussed challenge of relinquishing some care of baby to Dad who has had as much time around baby and doesn't know baby's needs/cues as well. Will need time and support to figure these things out for himself.  Offered Counseling, Zoloft. Pt undecided. Thinks she will see how it goes a little while longer.  3. Follow up in: 1 week or as needed.   4. Called attending to discuss bleeding eval, management. Not available. Since bleeding has lightened today, will check CBC, Rx Methergine x 24 hours. Discussed possible need for US, D&C if Methergine not effective.  5. Bleeding, Endometritis precautions.

## 2014-12-21 ENCOUNTER — Ambulatory Visit (INDEPENDENT_AMBULATORY_CARE_PROVIDER_SITE_OTHER): Payer: 59 | Admitting: Advanced Practice Midwife

## 2014-12-21 ENCOUNTER — Encounter: Payer: Self-pay | Admitting: Advanced Practice Midwife

## 2014-12-21 VITALS — BP 119/77 | HR 85 | Resp 16 | Ht 62.0 in | Wt 229.0 lb

## 2014-12-21 DIAGNOSIS — F53 Postpartum depression: Secondary | ICD-10-CM

## 2014-12-21 DIAGNOSIS — O99345 Other mental disorders complicating the puerperium: Principal | ICD-10-CM

## 2014-12-21 NOTE — Progress Notes (Signed)
   Subjective:    Patient ID: Eben Burownna Pauline Weyandt, female    DOB: 12/13/1985, 29 y.o.   MRN: [id number]  HPI: F/U for PP depression and prolonged PP bleeding. Mood better. Relationship w/ Husband improved. Did not start Zoloft due to coping better.   Bleeding stopped after Methergine.  Review of Systems: Otherwise negative.      Objective:   Physical Exam  General: Neg, A&Ox 4. Smiling. Normal affect.      Assessment & Plan:   1. Postpartum depression    F/U PRN.  [place] Poppy Mcafee, CNM 12/21/2014 11:17 AM

## 2014-12-21 NOTE — Patient Instructions (Signed)
Contraception Choices Contraception (birth control) is the use of any methods or devices to prevent pregnancy. Below are some methods to help avoid pregnancy. HORMONAL METHODS   Contraceptive implant. This is a thin, plastic tube containing progesterone hormone. It does not contain estrogen hormone. Your health care provider inserts the tube in the inner part of the upper arm. The tube can remain in place for up to 3 years. After 3 years, the implant must be removed. The implant prevents the ovaries from releasing an egg (ovulation), thickens the cervical mucus to prevent sperm from entering the uterus, and thins the lining of the inside of the uterus.  Progesterone-only injections. These injections are given every 3 months by your health care provider to prevent pregnancy. This synthetic progesterone hormone stops the ovaries from releasing eggs. It also thickens cervical mucus and changes the uterine lining. This makes it harder for sperm to survive in the uterus.  Birth control pills. These pills contain estrogen and progesterone hormone. They work by preventing the ovaries from releasing eggs (ovulation). They also cause the cervical mucus to thicken, preventing the sperm from entering the uterus. Birth control pills are prescribed by a health care provider.Birth control pills can also be used to treat heavy periods.  Minipill. This type of birth control pill contains only the progesterone hormone. They are taken every day of each month and must be prescribed by your health care provider.  Birth control patch. The patch contains hormones similar to those in birth control pills. It must be changed once a week and is prescribed by a health care provider.  Vaginal ring. The ring contains hormones similar to those in birth control pills. It is left in the vagina for 3 weeks, removed for 1 week, and then a new one is put back in place. The patient must be comfortable inserting and removing the ring  from the vagina.A health care provider's prescription is necessary.  Emergency contraception. Emergency contraceptives prevent pregnancy after unprotected sexual intercourse. This pill can be taken right after sex or up to 5 days after unprotected sex. It is most effective the sooner you take the pills after having sexual intercourse. Most emergency contraceptive pills are available without a prescription. Check with your pharmacist. Do not use emergency contraception as your only form of birth control. BARRIER METHODS   Female condom. This is a thin sheath (latex or rubber) that is worn over the penis during sexual intercourse. It can be used with spermicide to increase effectiveness.  Female condom. This is a soft, loose-fitting sheath that is put into the vagina before sexual intercourse.  Diaphragm. This is a soft, latex, dome-shaped barrier that must be fitted by a health care provider. It is inserted into the vagina, along with a spermicidal jelly. It is inserted before intercourse. The diaphragm should be left in the vagina for 6 to 8 hours after intercourse.  Cervical cap. This is a round, soft, latex or plastic cup that fits over the cervix and must be fitted by a health care provider. The cap can be left in place for up to 48 hours after intercourse.  Sponge. This is a soft, circular piece of polyurethane foam. The sponge has spermicide in it. It is inserted into the vagina after wetting it and before sexual intercourse.  Spermicides. These are chemicals that kill or block sperm from entering the cervix and uterus. They come in the form of creams, jellies, suppositories, foam, or tablets. They do not require a   prescription. They are inserted into the vagina with an applicator before having sexual intercourse. The process must be repeated every time you have sexual intercourse. INTRAUTERINE CONTRACEPTION  Intrauterine device (IUD). This is a T-shaped device that is put in a woman's uterus  during a menstrual period to prevent pregnancy. There are 2 types:  Copper IUD. This type of IUD is wrapped in copper wire and is placed inside the uterus. Copper makes the uterus and fallopian tubes produce a fluid that kills sperm. It can stay in place for 10 years.  Hormone IUD. This type of IUD contains the hormone progestin (synthetic progesterone). The hormone thickens the cervical mucus and prevents sperm from entering the uterus, and it also thins the uterine lining to prevent implantation of a fertilized egg. The hormone can weaken or kill the sperm that get into the uterus. It can stay in place for 3-5 years, depending on which type of IUD is used. PERMANENT METHODS OF CONTRACEPTION  Female tubal ligation. This is when the woman's fallopian tubes are surgically sealed, tied, or blocked to prevent the egg from traveling to the uterus.  Hysteroscopic sterilization. This involves placing a small coil or insert into each fallopian tube. Your doctor uses a technique called hysteroscopy to do the procedure. The device causes scar tissue to form. This results in permanent blockage of the fallopian tubes, so the sperm cannot fertilize the egg. It takes about 3 months after the procedure for the tubes to become blocked. You must use another form of birth control for these 3 months.  Female sterilization. This is when the female has the tubes that carry sperm tied off (vasectomy).This blocks sperm from entering the vagina during sexual intercourse. After the procedure, the man can still ejaculate fluid (semen). NATURAL PLANNING METHODS  Natural family planning. This is not having sexual intercourse or using a barrier method (condom, diaphragm, cervical cap) on days the woman could become pregnant.  Calendar method. This is keeping track of the length of each menstrual cycle and identifying when you are fertile.  Ovulation method. This is avoiding sexual intercourse during ovulation.  Symptothermal  method. This is avoiding sexual intercourse during ovulation, using a thermometer and ovulation symptoms.  Post-ovulation method. This is timing sexual intercourse after you have ovulated. Regardless of which type or method of contraception you choose, it is important that you use condoms to protect against the transmission of sexually transmitted infections (STIs). Talk with your health care provider about which form of contraception is most appropriate for you. Document Released: 11/27/2005 Document Revised: 12/02/2013 Document Reviewed: 05/22/2013 [employer] Patient Information 2015 [employer]. This information is not intended to replace advice given to you by your health care provider. Make sure you discuss any questions you have with your health care provider.  

## 2015-08-31 IMAGING — US US OB DETAIL+14 WK
2 series · 12 of 28 positions shown · non-contrast
Comparison: none

[Series 1: us ob comp +14 wk · 1 of 5 slices shown (1 of 2)]
[im 5/5]
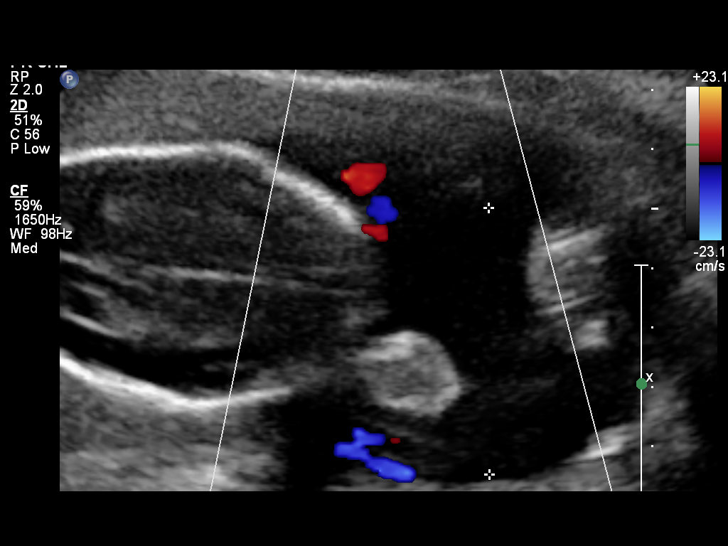

[Series 1: us ob comp +14 wk · 11 of 60 slices shown (2 of 2)]
[im 3/60]
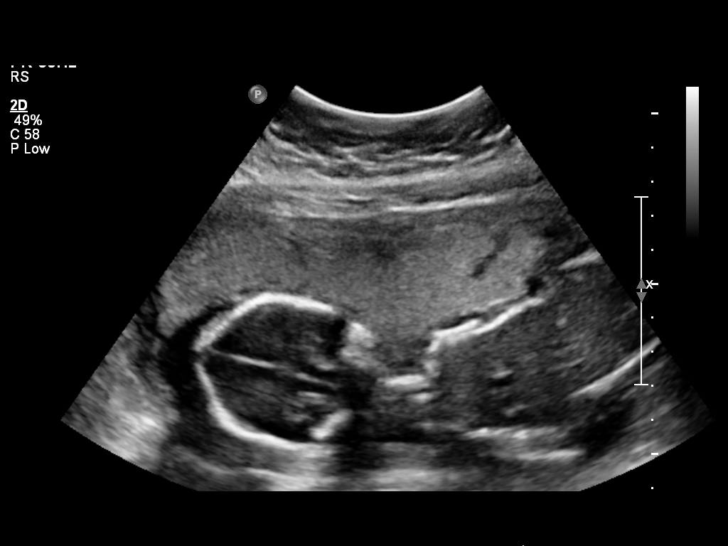
[im 8/60]
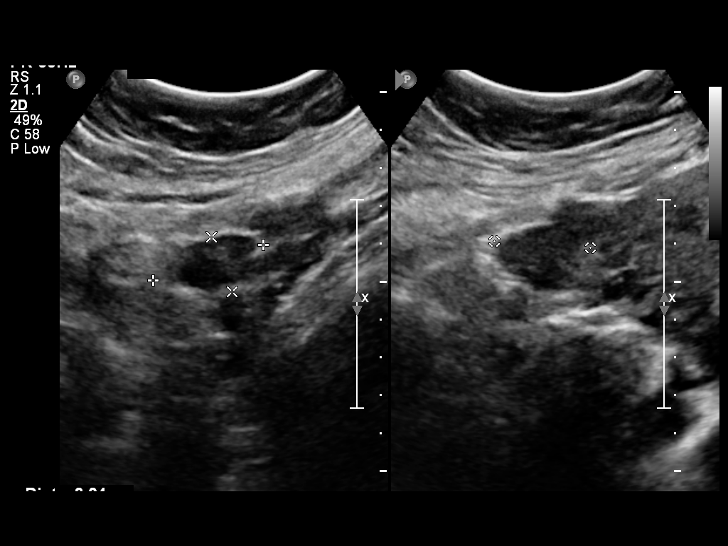
[im 15/60]
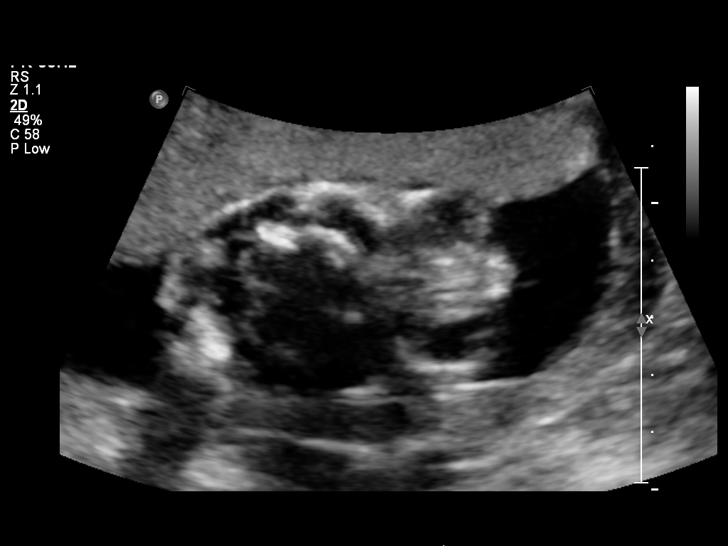
[im 19/60]
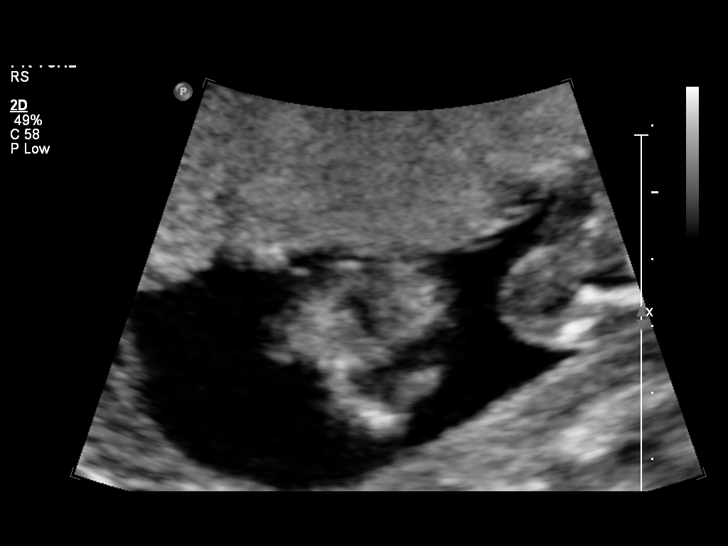
[im 24/60]
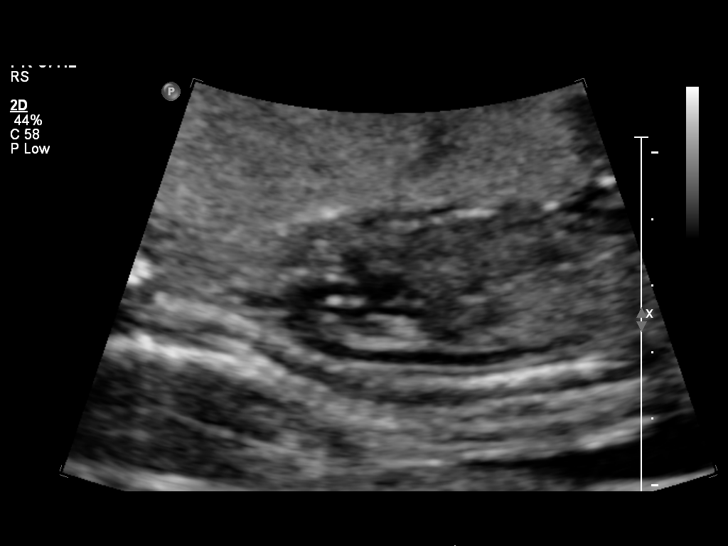
[im 31/60]
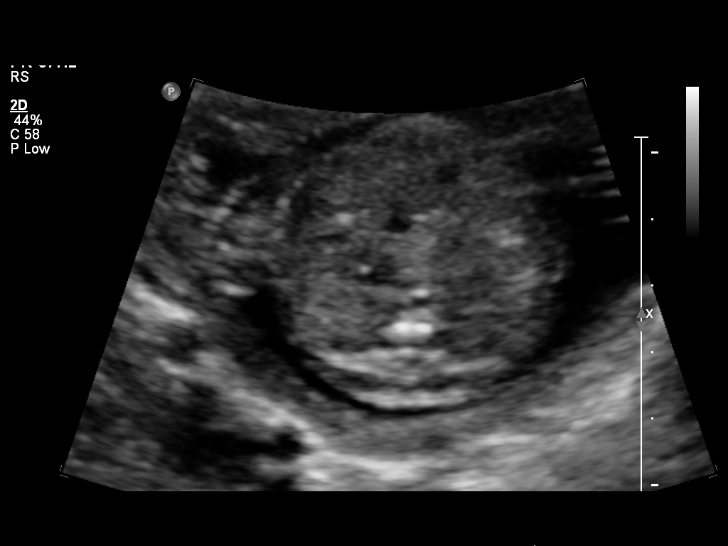
[im 36/60]
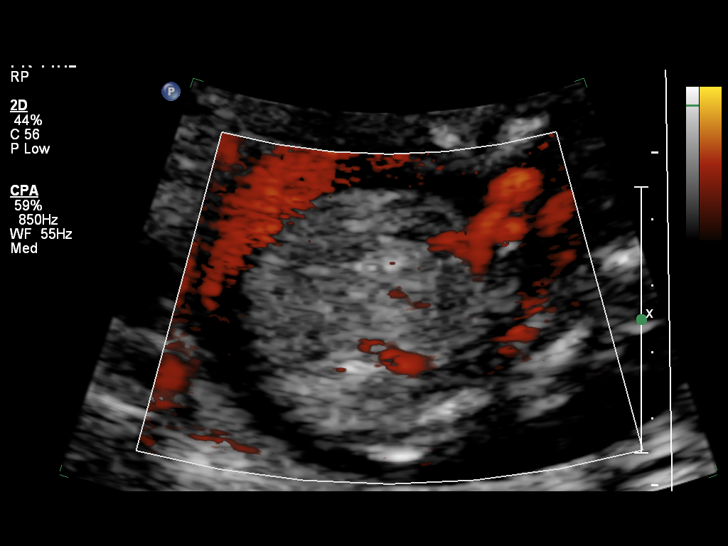
[im 41/60]
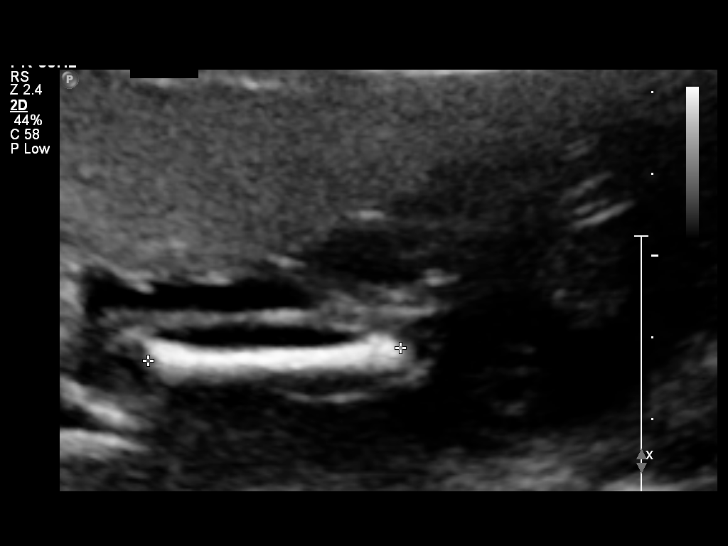
[im 48/60]
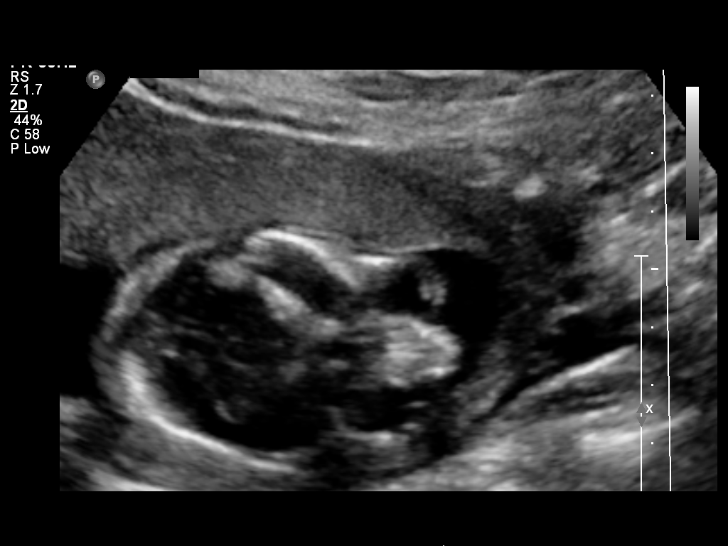
[im 52/60]
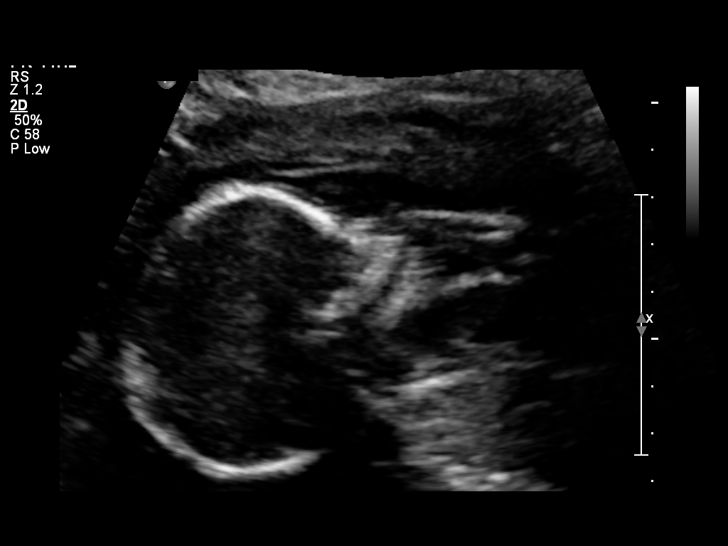
[im 57/60]
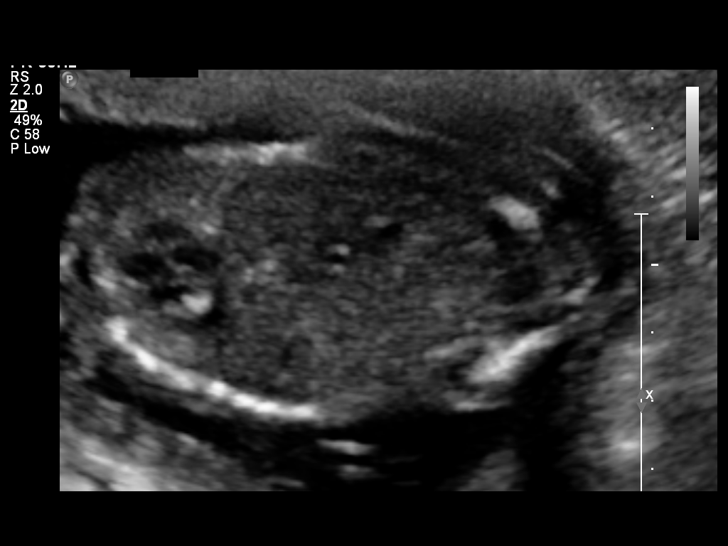

[12 of 28 positions shown; findings below may reference images not displayed]

OBSTETRICS REPORT
                      (Signed Final 06/11/2014 [DATE])

Service(s) Provided

 US OB DETAIL + 14 WK                                  76811.0
Indications

 Epilepsy complicating pregnancy, childbirth, or the
 puerperium, antepartum
 Detailed fetal anatomic survey
Fetal Evaluation

 Num Of Fetuses:    1
 Fetal Heart Rate:  150                          bpm
 Cardiac Activity:  Observed
 Presentation:      Breech
 Placenta:          Anterior, above cervical os
 P. Cord            Visualized, central
 Insertion:
Biometry

 BPD:     41.5  mm     G. Age:  18w 4d                CI:        58.05   70 - 86
                                                      FL/HC:      18.1   16.8 -

 HC:     177.4  mm     G. Age:  20w 2d       72  %    HC/AC:      1.28   1.09 -

 AC:     138.9  mm     G. Age:  19w 2d       36  %    FL/BPD:
 FL:      32.1  mm     G. Age:  20w 0d       58  %    FL/AC:      23.1   20 - 24
 HUM:     31.2  mm     G. Age:  20w 3d       73  %

 Est. FW:     305  gm    0 lb 11 oz      49  %
Gestational Age

 LMP:           20w 6d        Date:  01/16/14                 EDD:   10/23/14
 U/S Today:     19w 4d                                        EDD:   11/01/14
 Best:          19w 4d     Det. By:  U/S (06/11/14)           EDD:   11/01/14
Anatomy

 Cranium:          Appears normal         Aortic Arch:      Appears normal
 Fetal Cavum:      Appears normal         Ductal Arch:      Appears normal
 Ventricles:       Appears normal         Diaphragm:        Appears normal
 Choroid Plexus:   Appears normal         Stomach:          Appears normal, left
                                                            sided
 Cerebellum:       Appears normal         Abdomen:          Appears normal
 Posterior Fossa:  Appears normal         Abdominal Wall:   Appears nml (cord
                                                            insert, abd wall)
 Nuchal Fold:      Not well visualized    Cord Vessels:     Appears normal (3
                                                            vessel cord)
 Face:             Appears normal         Kidneys:          Appear normal
                   (orbits and profile)
 Lips:             Not well visualized    Bladder:          Appears normal
 Heart:            Not well visualized    Spine:            Not well visualized
 RVOT:             Not well visualized    Lower             Appears normal
                                          Extremities:
 LVOT:             Not well visualized    Upper             Appears normal
                                          Extremities:

 Other:  Fetus appears to be a female. Nasal bone visualized. Technically
         difficult due to fetal position.
Cervix Uterus Adnexa

 Cervical Length:    3.51     cm

 Cervix:       Normal appearance by transabdominal scan.

 Left Ovary:    Within normal limits.
 Right Ovary:   Within normal limits.
 Adnexa:     No abnormality visualized.
Impression

 Single IUP at 19w 4d
 Normal anatomic fetal survey.
 Limited views of the fetal heart obtained due to fetal position.
 No markers associated with aneuploidy appreciated.
 Anterior placenta without previa
 Normal amniotic fluid volume
Recommendations

 Recommend follow-up ultrasound examination in 4 weeks to
 reevaluate the fetal heart

## 2015-10-18 IMAGING — US US OB FOLLOW-UP
1 series · 12 of 28 positions shown · non-contrast
Comparison: none

[Series 1: us ob follow up · 64 acquisitions, 12 frames shown]
[im 3/64]
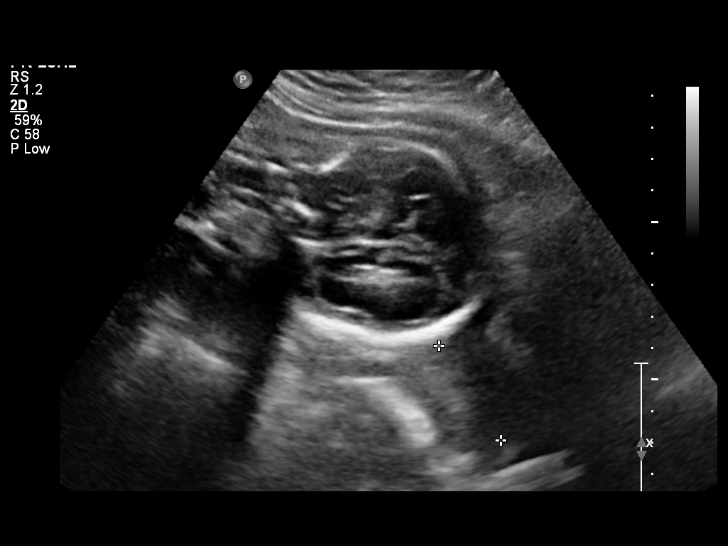
[im 8/64]
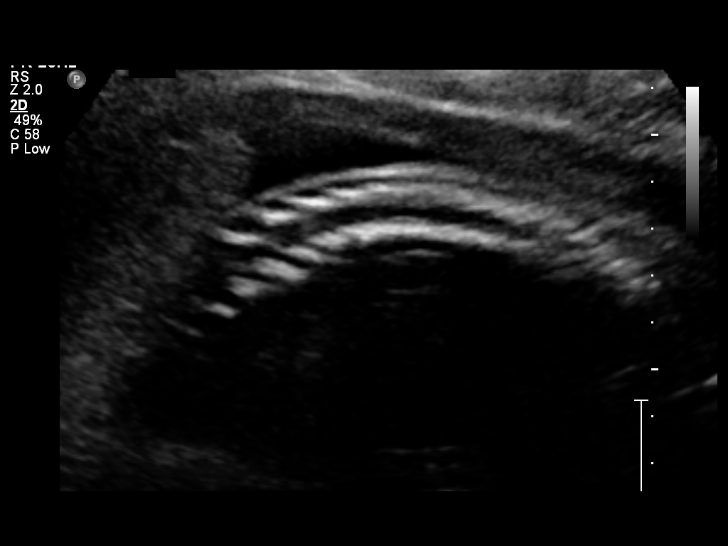
[im 12/64]
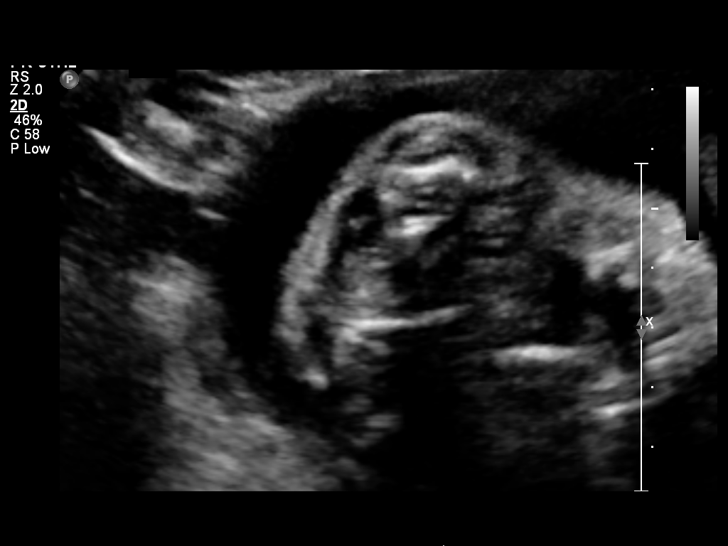
[im 19/64]
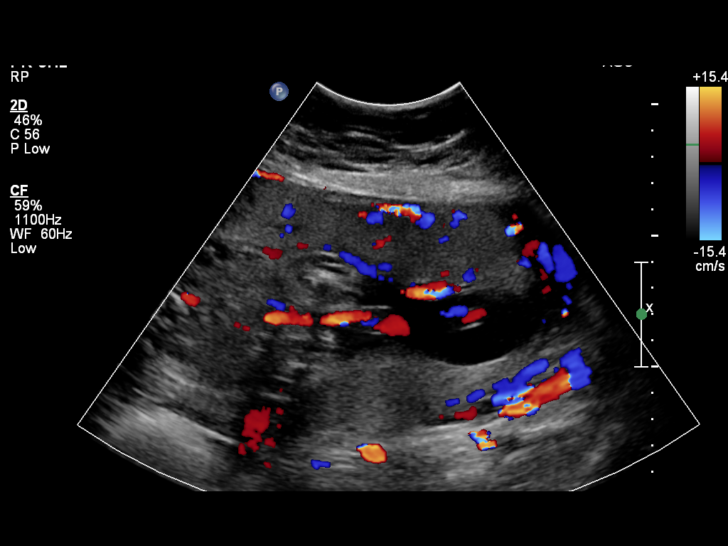
[im 24/64]
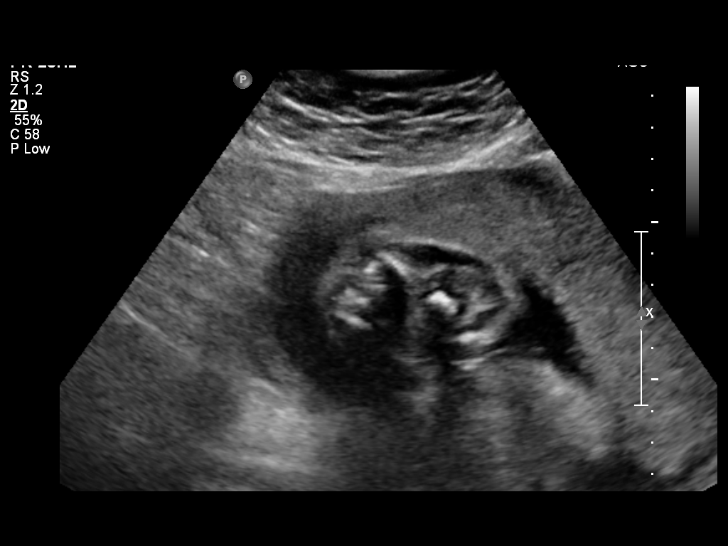
[im 29/64]
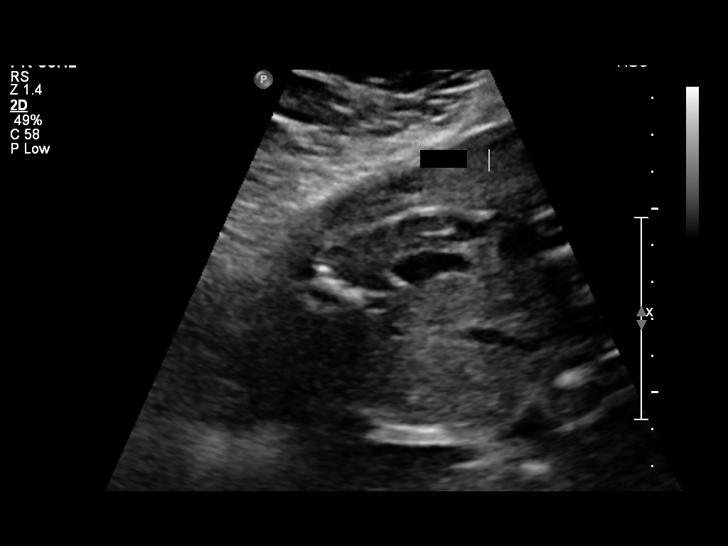
[im 36/64]
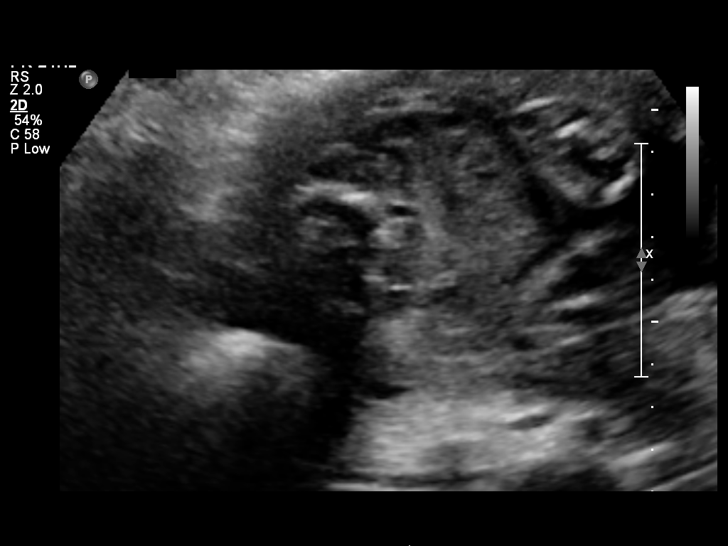
[im 40/64]
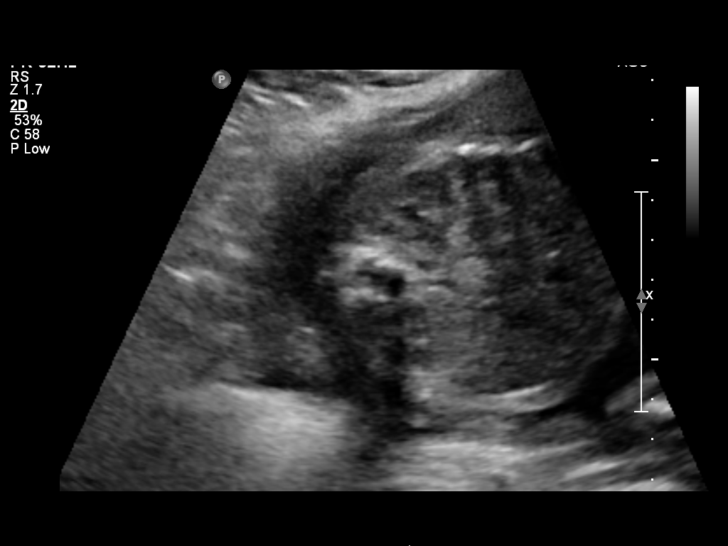
[im 45/64]
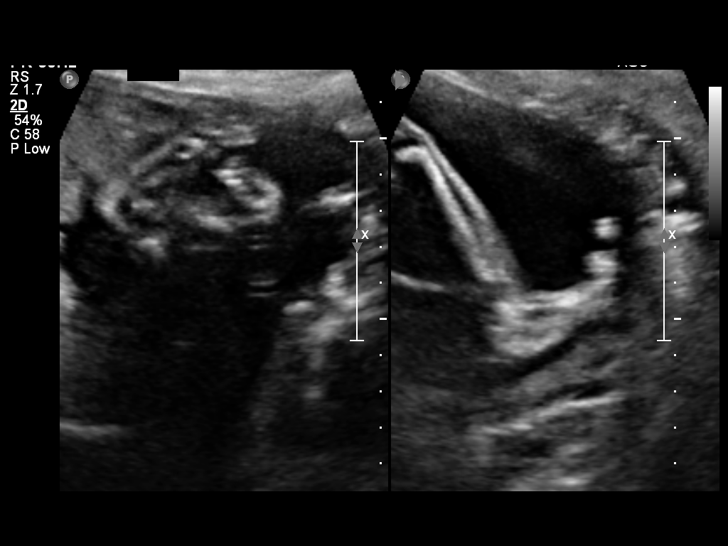
[im 52/64]
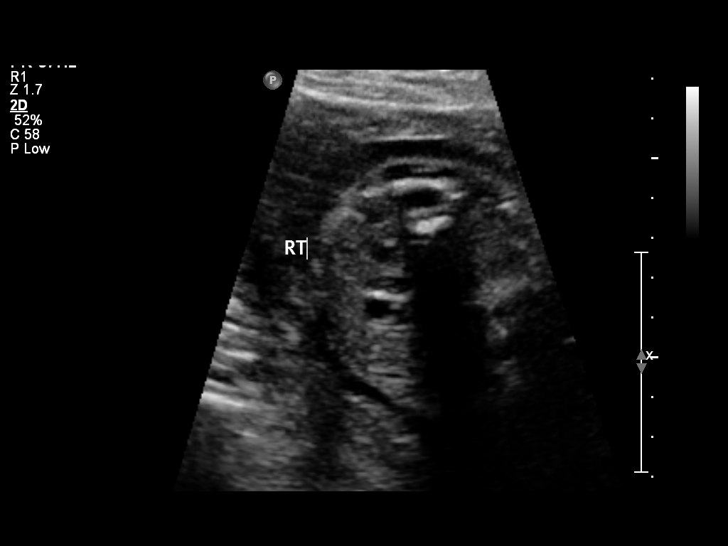
[im 57/64]
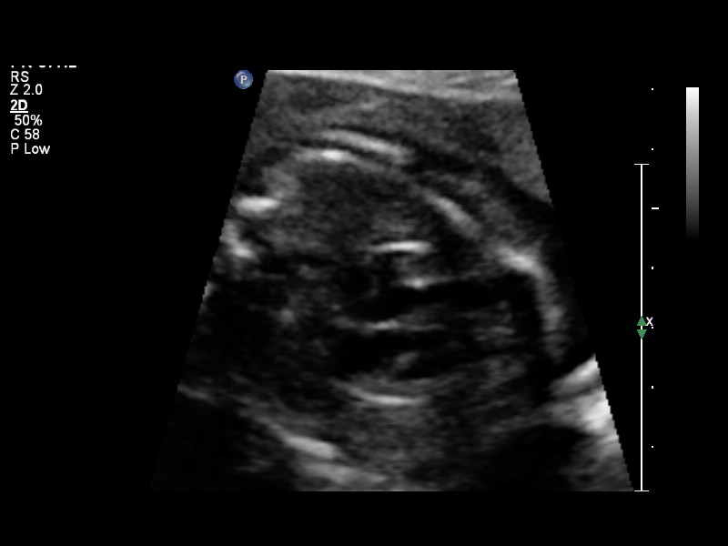
[im 61/64]
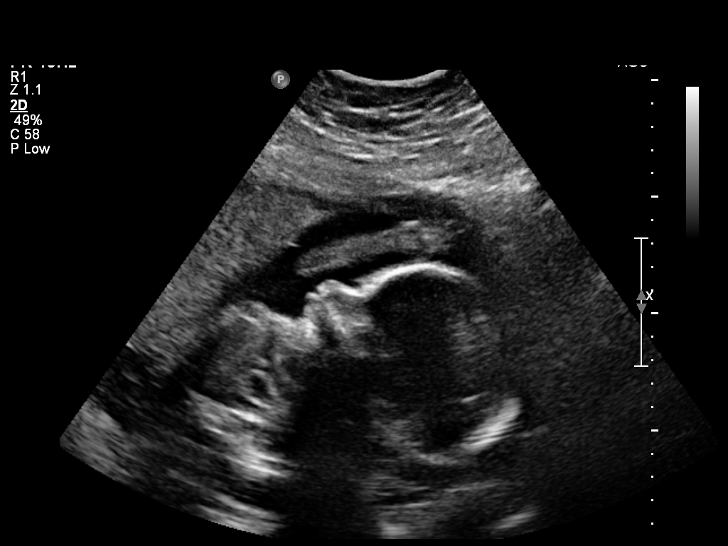

[12 of 28 positions shown; findings below may reference images not displayed]

OBSTETRICS REPORT
                      (Signed Final 07/29/2014 [DATE])

Service(s) Provided

 US OB FOLLOW UP                                       76816.1
Indications

 Follow-up incomplete fetal anatomic evaluation
 Epilepsy (Lamictal)
Fetal Evaluation

 Num Of Fetuses:    1
 Fetal Heart Rate:  138                          bpm
 Cardiac Activity:  Observed
 Presentation:      Cephalic
 Placenta:          Anterior, above cervical os
 P. Cord            Not well visualized
 Insertion:

 Amniotic Fluid
 AFI FV:      Subjectively within normal limits
                                             Larg Pckt:    4.97  cm
Biometry

 BPD:     65.3  mm     G. Age:  26w 3d                CI:        64.73   70 - 86
                                                      FL/HC:      19.6   18.6 -

 HC:     260.9  mm     G. Age:  28w 3d       84  %    HC/AC:      1.13   1.04 -

 AC:     230.7  mm     G. Age:  27w 3d       72  %    FL/BPD:     78.3   71 - 87
 FL:      51.1  mm     G. Age:  27w 2d       64  %    FL/AC:      22.1   20 - 24

 Est. FW:    6081  gm      2 lb 6 oz     74  %
Gestational Age

 LMP:           27w 5d        Date:  01/16/14                 EDD:   10/23/14
 U/S Today:     27w 3d                                        EDD:   10/25/14
 Best:          26w 3d     Det. By:  U/S (06/11/14)           EDD:   11/01/14
Anatomy

 Cranium:          Appears normal         Aortic Arch:      Previously seen
 Fetal Cavum:      Previously seen        Ductal Arch:      Previously seen
 Ventricles:       Appears normal         Diaphragm:        Previously seen
 Choroid Plexus:   Previously seen        Stomach:          Appears normal, left
                                                            sided
 Cerebellum:       Appears normal         Abdomen:          Appears normal
 Posterior Fossa:  Appears normal         Abdominal Wall:   Appears nml (cord
                                                            insert, abd wall)
 Nuchal Fold:      Not applicable (>20    Cord Vessels:     Appears normal (3
                   wks GA)                                  vessel cord)
 Face:             Orbits and profile     Kidneys:          Appear normal
                   previously seen
 Lips:             Appears normal         Bladder:          Appears normal
 Heart:            Echogenic focus        Spine:            Appears normal
                   in LV
 RVOT:             Not well visualized    Lower             Previously seen
                                          Extremities:
 LVOT:             Not well visualized    Upper             Previously seen
                                          Extremities:

 Other:  Fetus appears to be a female. Nasal bone visualized. Heels
         visualized. Technically difficult due to fetal position.
Cervix Uterus Adnexa

 Cervical Length:    3.57     cm

 Cervix:       Normal appearance by transabdominal scan.
 Uterus:       No abnormality visualized.
 Cul De Sac:   No free fluid seen.
 Left Ovary:    Not visualized.
 Right Ovary:   Not visualized.
 Adnexa:     No abnormality visualized.
Comments

 An echogenic focus was seen in the left cardiac ventricle.
 This is felt to represent a calcified papillary muscle, and is not
 associated with structural or functional cardiac abnormalities.
 Although an echogenic cardiac focus may be associated with
 an increased risk of Down syndrome, this risk is felt to be
 minimal, especially when it is seen as an isolated finding.
Impression

 Single IUP at 26w 3d
 Hx of seizure disorder on Lamictal
 Echogenic intracardiac focus noted in the left ventricle (see
 comments)
 Limted views of the fetal heart again obtained (outflow tracts)
 Fetal growth is appropriate (74th %tile)
 Normal amniotic fluid volume
Recommendations

 Recommend follow up in 4 weeks for growth and to
 reevaluate the fetal heart (would offer follow up at the [facility]
 due to hx of Lamictal use)

## 2015-11-23 IMAGING — US US UA CORD DOPPLER
1 series · 9 of 9 positions shown · non-contrast
Comparison: none

[Series 1: us ua cord doppler · 0.32mm/px · 9 acquisitions, 9 frames shown]
[im 1/9]
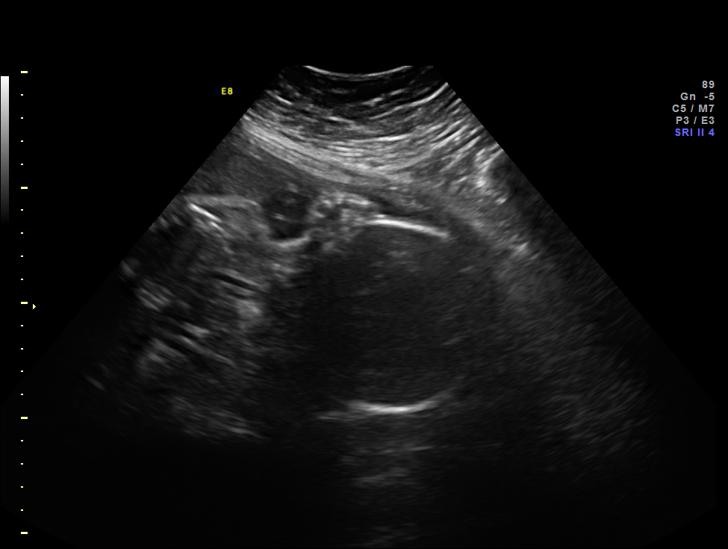
[im 2/9]
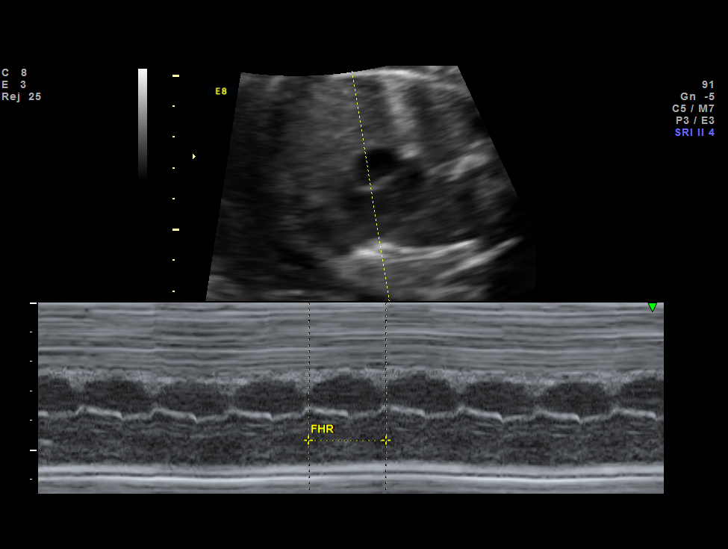
[im 3/9]
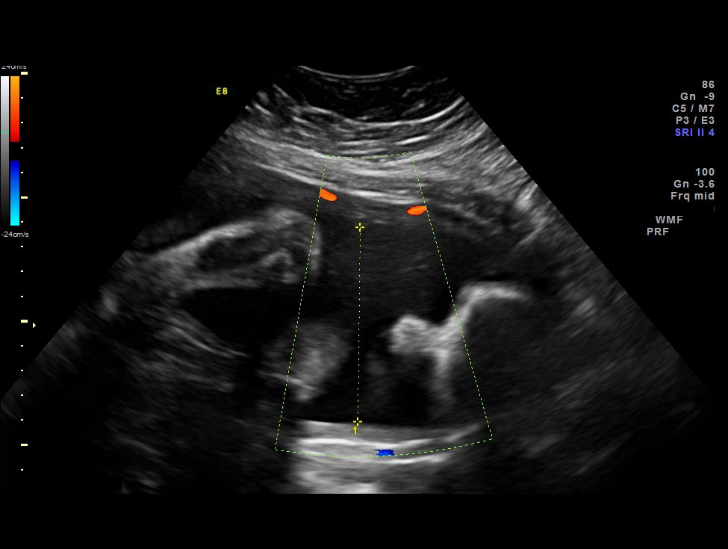
[im 4/9]
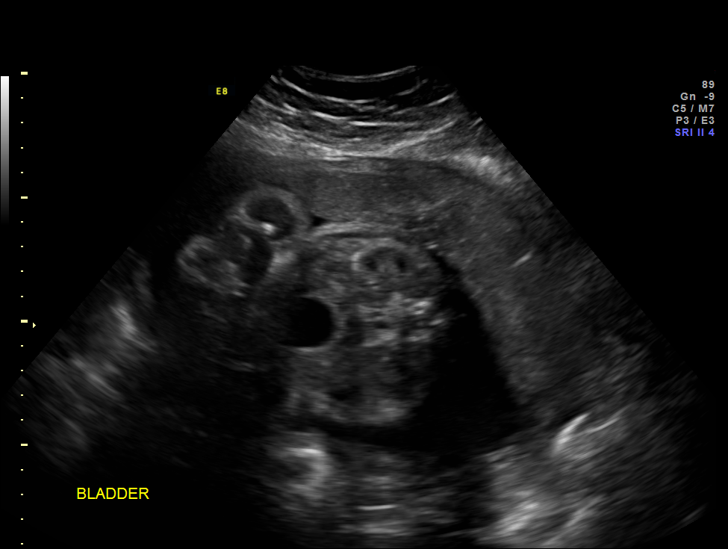
[im 5/9]
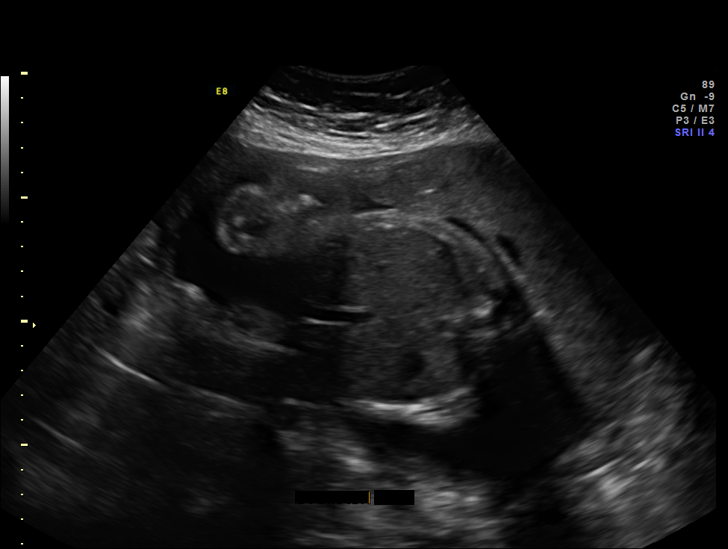
[im 6/9]
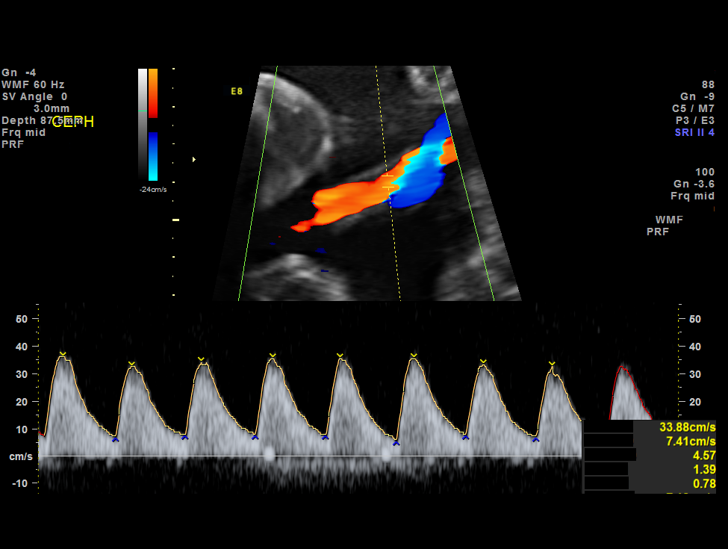
[im 7/9]
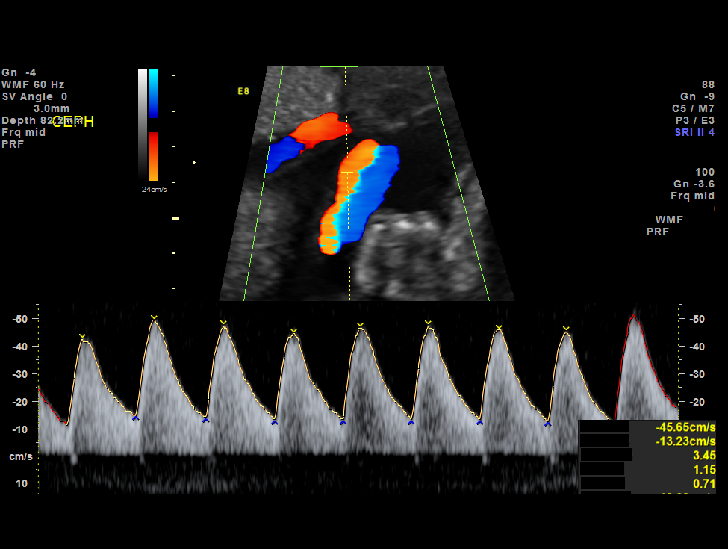
[im 8/9]
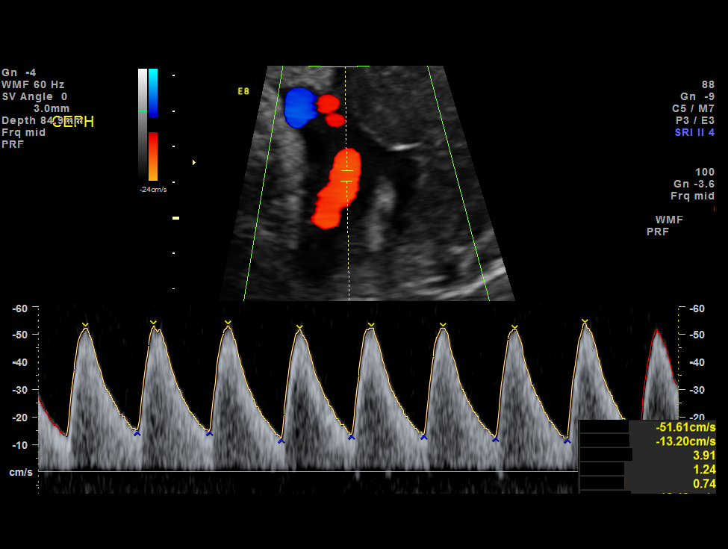
[im 9/9]
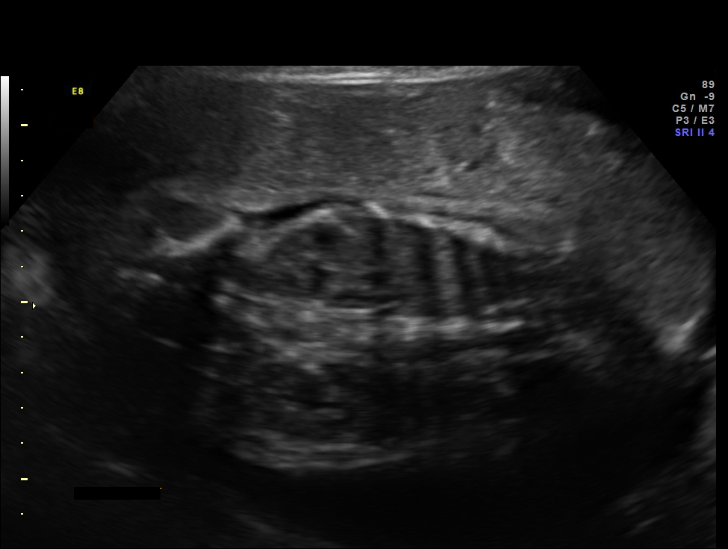

[9 of 9 positions shown; findings below may reference images not displayed]

OBSTETRICS REPORT
                      (Signed Final 09/03/2014 [DATE])

Service(s) Provided

 US UA CORD DOPPLER                                    76820.0
Indications

 Size less than dates (Small for gestational [AGE]
 FGR) - AC 8%
 Elevated UA Dopplers
 Echogenic intracardiac focus of the heart  (EIF)
 Epilepsy (Lamictal)
 Maternal obesity
Fetal Evaluation

 Num Of Fetuses:    1
 Fetal Heart Rate:  130                          bpm
 Cardiac Activity:  Observed
 Presentation:      Cephalic
 Placenta:          Anterior, above cervical os
 P. Cord            Previously Visualized
 Insertion:

 Amniotic Fluid
 AFI FV:      Subjectively within normal limits
                                             Larg Pckt:     7.8  cm
Biophysical Evaluation

 Amniotic F.V:   Pocket => 2 cm two         F. Tone:        Observed
                 planes
 F. Movement:    Observed                   Score:          [DATE]
 F. Breathing:   Observed
Gestational Age

 LMP:           32w 6d        Date:  01/16/14                 EDD:   10/23/14
 Best:          31w 4d     Det. By:  U/S (06/11/14)           EDD:   11/01/14
Doppler - Fetal Vessels

 Umbilical Artery
 S/D:   3.98           95  %tile       RI:
 PI:    1.26                           PSV:       51.61   cm/s
 Umbilical Artery
 Absent DFV:    No     Reverse DFV:    No

Impression

 Single IUP at 31 [DATE] weeks
 Lagging growth, eleveted UA Doppler studies noted on last
 exam
 Active fetus with BPP of [DATE]
 UA Doppler studies - elevated S/D ratio (95th%tile).  No
 AEDF or REDF noted.
 Normal amniotic fluid volume
Recommendations

 Continue weekly BPPs with UA Doppler studies.
 Plan 2x weekly NSTs with weekly AFIs and UA Doppler
 studies starting at 34 weeks.

## 2016-01-11 IMAGING — US US OB FOLLOW-UP
1 series · 12 of 28 positions shown · non-contrast
Comparison: none

[Series 1: us ob follow-up · 0.21mm/px · 12 of 51 slices shown]
[im 2/51]
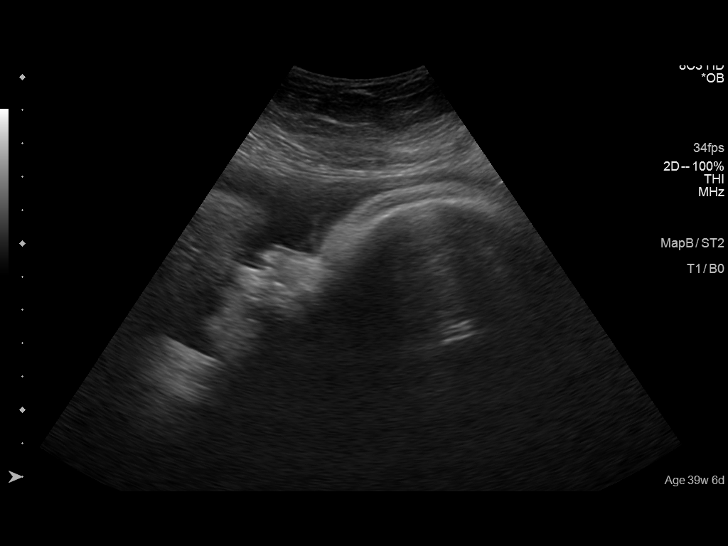
[im 6/51]
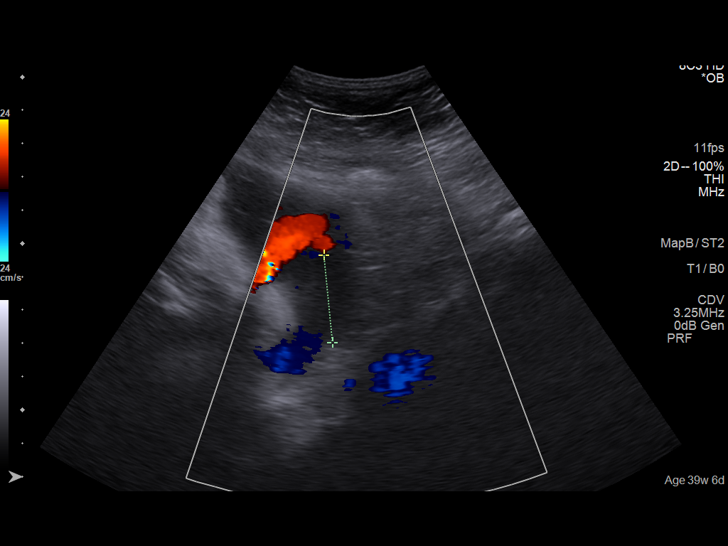
[im 10/51]
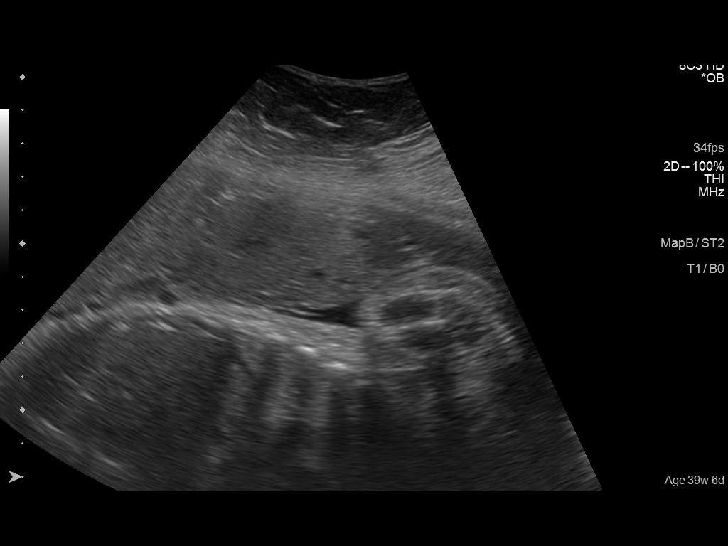
[im 15/51]
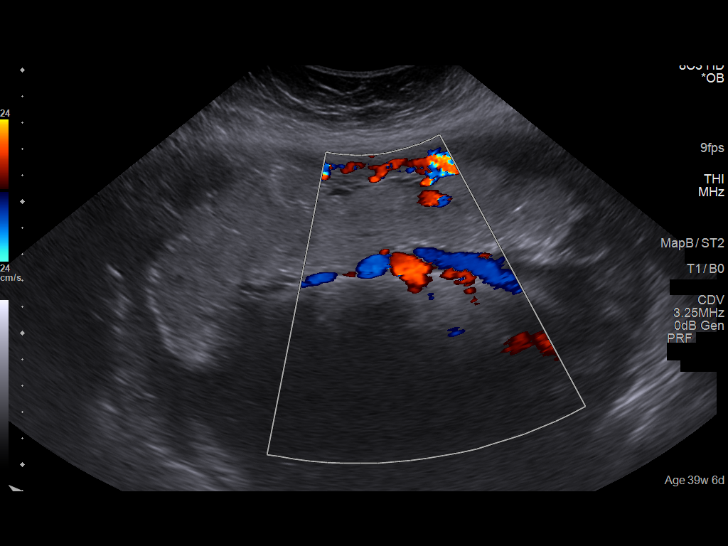
[im 19/51]
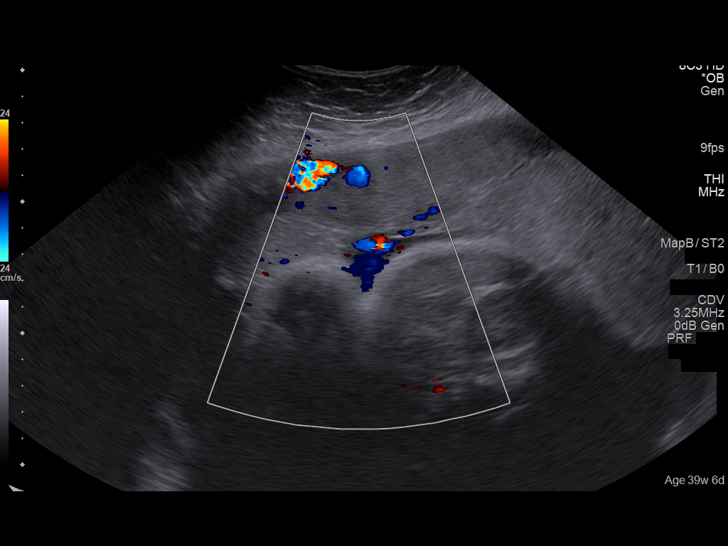
[im 23/51]
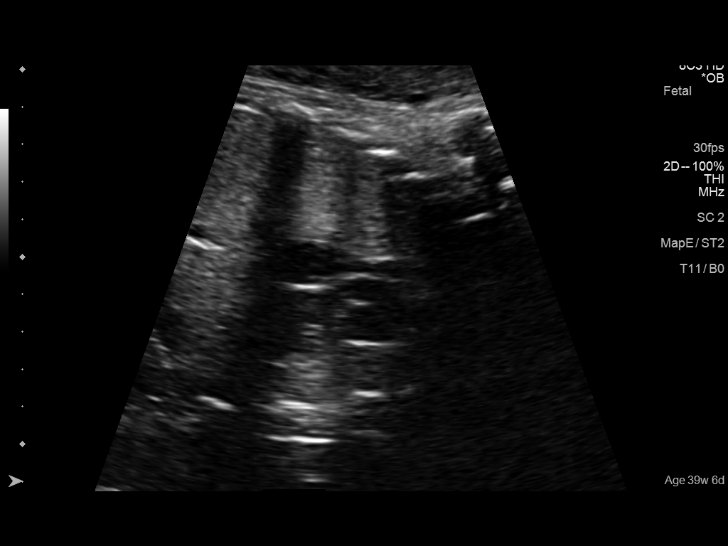
[im 28/51]
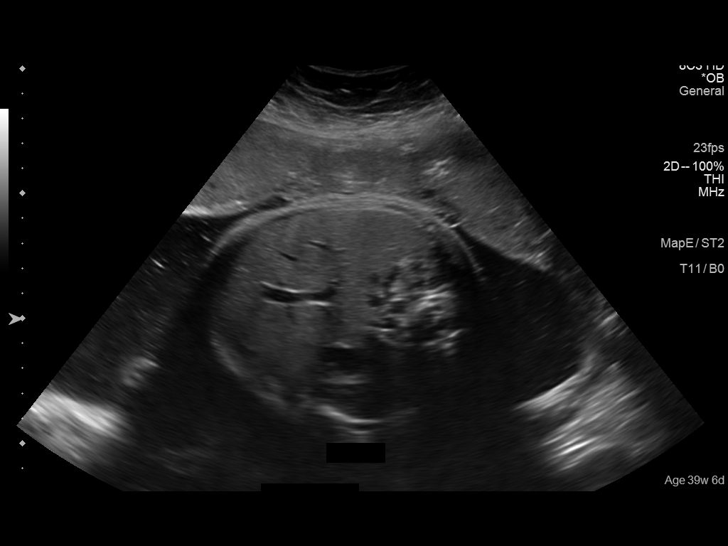
[im 32/51]
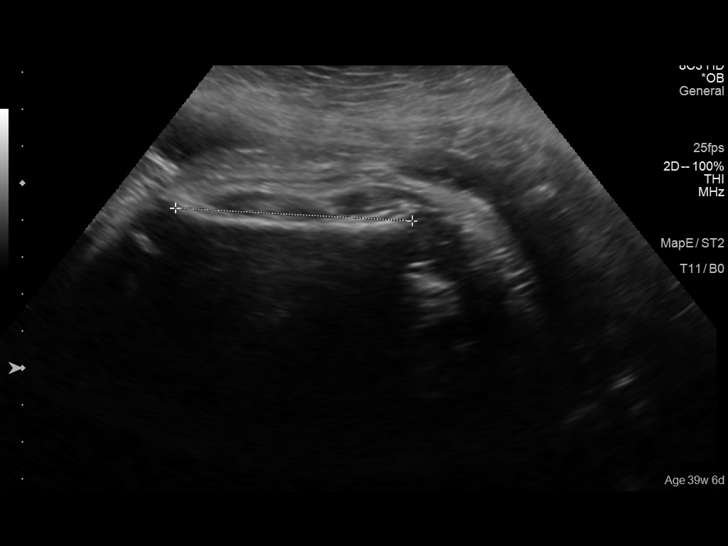
[im 36/51]
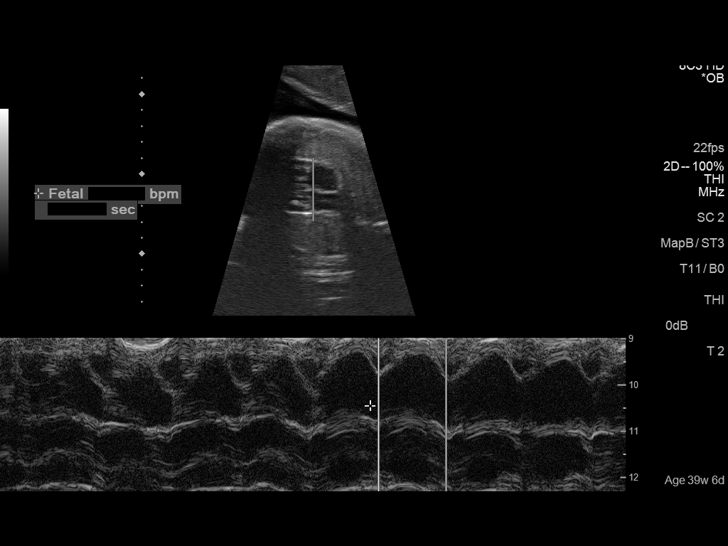
[im 41/51]
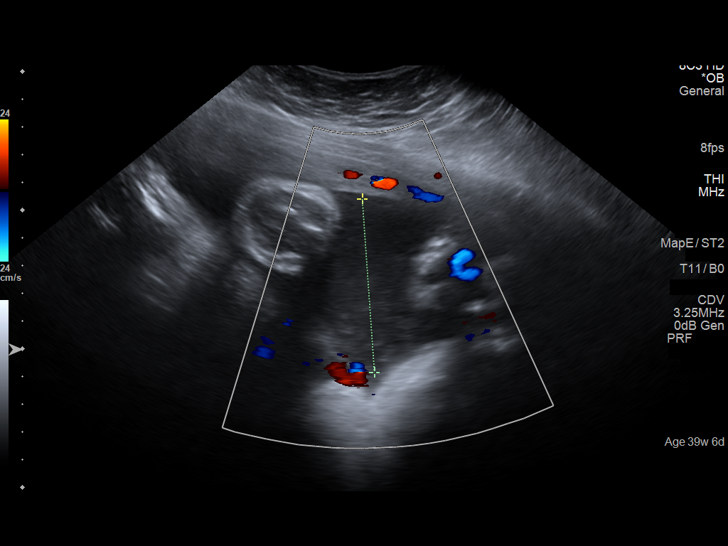
[im 45/51]
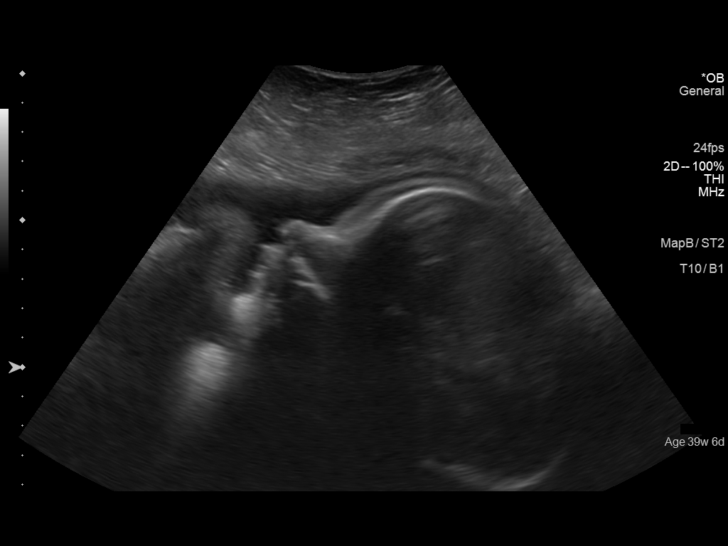
[im 49/51]
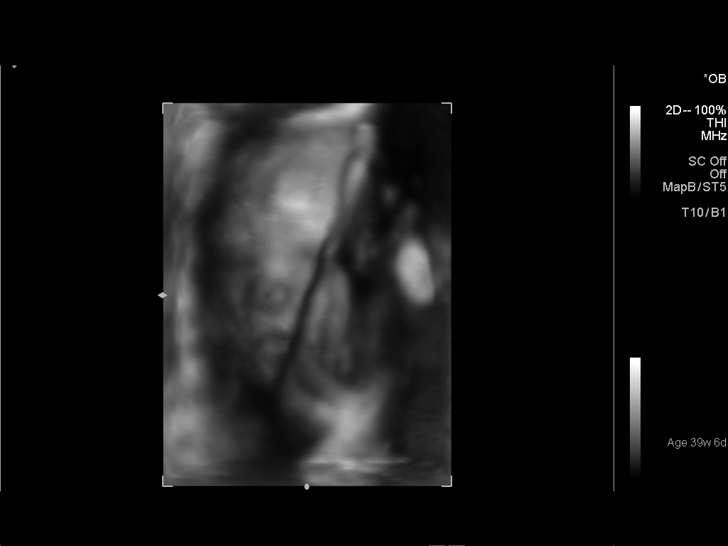

[12 of 28 positions shown; findings below may reference images not displayed]

OBSTETRICS REPORT
                      (Signed Final 10/22/2014 [DATE])

Service(s) Provided

 US OB FOLLOW UP                                       76816.1
Indications

 Echogenic intracardiac focus of the heart  (EIF)
 Maternal care for known of suspected poor fetal
 growth, third trimester, not applicable or unspecified
 Epilepsy complicating pregnancy, childbirth, or the
 puerperium, antepartum (Lamictal)
 Maternal morbid obesity
 39 weeks gestation of pregnancy
Fetal Evaluation

 Num Of Fetuses:    1
 Fetal Heart Rate:  152                          bpm
 Cardiac Activity:  Observed
 Presentation:      Cephalic
 Placenta:          Anterior, above cervical os
 P. Cord            Visualized, central
 Insertion:

 Amniotic Fluid
 AFI FV:      Subjectively upper-normal
 AFI Sum:     22.5    cm       95  %Tile     Larg Pckt:    6.26  cm
 RUQ:   5.03    cm   RLQ:    4.98   cm    LUQ:   6.26    cm   LLQ:    6.23   cm
Biometry

 BPD:     92.7  mm     G. Age:  37w 5d                CI:        72.39   70 - 86
                                                      FL/HC:      20.5   20.6 -

 HC:     346.6  mm     G. Age:  40w 1d       59   %   HC/AC:      1.01   0.87 -

 AC:     342.1  mm     G. Age:  38w 1d       37   %   FL/BPD:     76.6   71 - 87
 FL:        71  mm     G. Age:  36w 3d         4  %   FL/AC:      20.8   20 - 24
 HUM:       64  mm     G. Age:  37w 1d       34   %

 Est. FW:    1117   gm     7 lb 6 oz     60  %
Gestational Age

 LMP:           39w 6d        Date:  01/16/14                 EDD:   10/23/14
 U/S Today:     38w 1d                                        EDD:   11/04/14
 Best:          39w 3d     Det. By:  Early Ultrasound         EDD:   10/26/14
                                     (03/02/14)
Anatomy

 Cranium:          Appears normal         Aortic Arch:      Previously seen
 Fetal Cavum:      Previously seen        Ductal Arch:      Previously seen
 Ventricles:       Appears normal         Diaphragm:        Appears normal
 Choroid Plexus:   Previously seen        Stomach:          Appears normal, left
                                                            sided
 Cerebellum:       Previously seen        Abdomen:          Appears normal
 Posterior Fossa:  Previously seen        Abdominal Wall:   Previously seen
 Nuchal Fold:      Not applicable (>20    Cord Vessels:     Previously seen
                   wks GA)
 Face:             Orbits and profile     Kidneys:          Appear normal
                   previously seen
 Lips:             Previously seen        Bladder:          Appears normal
 Heart:            Previously seen        Spine:            Previously seen
 RVOT:             Previously seen        Lower             Previously seen
                                          Extremities:
 LVOT:             Previously seen        Upper             Previously seen
                                          Extremities:

 Other:  Female gender previously seen. Nasal bone previously visualized.
         Heels previously visualized.
Cervix Uterus Adnexa

 Cervix:       Not visualized (advanced GA >35wks)
 Uterus:       No abnormality visualized.
 Cul De Sac:   No free fluid seen.

 Left Ovary:    Not visualized.
 Right Ovary:   Not visualized.
 Adnexa:     No abnormality visualized.
Impression

 Single IUP at 39w 3d
 Interval growth is appropriate (60th %tile)
 Normal interval anatomy, although somewhat limited due to
 late gestational age
 Anterior placenta without previa
 Normal amniotic fluid volume
Recommendations

 Follow-up ultrasound as clinically indicated

## 2020-08-06 ENCOUNTER — Ambulatory Visit: Payer: Self-pay

## 2020-11-25 NOTE — Patient Instructions (Addendum)
YOU ARE SCHEDULED FOR A COVID TEST _12/27________@_1 :00 pm___________. THIS TEST MUST BE DONE BEFORE SURGERY. GO TO  [address] [place], [place], IT IS APPROXIMATELY 2 MINUTES PAST [employer] ON THE RIGHT AND REMAIN IN YOUR CAR, THIS IS A DRIVE UP TEST. ONCE YOUR COVID TEST IS DONE PLEASE FOLLOW ALL THE QUARANTINE  INSTRUCTIONS GIVEN IN YOUR HANDOUT.      Your procedure is scheduled on 11/29/20   Report to [facility] AT 5:30  A. M.   Call this number if you have problems the morning of surgery  :[phone].   OUR ADDRESS IS [address].   WE ARE LOCATED IN [facility].  PLEASE BRING YOUR INSURANCE CARD AND PHOTO ID DAY OF SURGERY.  ONLY ONE PERSON ALLOWED IN FACILITY WAITING AREA.                                     REMEMBER:  DO NOT EAT FOOD OR DRINK LIQUIDS AFTER MIDNIGHT .   YOU MAY  BRUSH YOUR TEETH MORNING OF SURGERY AND RINSE YOUR MOUTH OUT, NO CHEWING GUM CANDY OR MINTS.    TAKE THESE MEDICATIONS MORNING OF SURGERY WITH A SIP OF WATER:  Lamotragine, Pantoprazole   ONE VISITOR IS ALLOWED IN WAITING ROOM ONLY DAY OF SURGERY.   NO VISITOR MAY SPEND THE NIGHT.   VISITOR ARE ALLOWED TO STAY UNTIL 8:00 PM.                                     DO NOT WEAR JEWERLY, MAKE UP, OR NAIL POLISH ON FINGERNAILS.  DO NOT WEAR LOTIONS, POWDERS, PERFUMES OR DEODORANT . DO NOT SHAVE FOR 24 HOURS PRIOR TO DAY OF SURGERY.  CONTACTS, GLASSES, OR DENTURES MAY NOT BE WORN TO SURGERY.                                    [name] IS NOT RESPONSIBLE  FOR ANY BELONGINGS.                                                                    Marland Kitchen                                                                                                    [facility] - Preparing for Surgery  Before surgery, you can play an important role.  Because skin is not sterile, your skin needs to be as free of germs as possible.  You can reduce the number of  germs on your skin by washing with  CHG (chlorahexidine gluconate) soap before surgery.  CHG is an antiseptic cleaner which kills germs and bonds with the skin to continue killing germs even after washing. Please DO NOT use if you have an allergy to CHG or antibacterial soaps.  If your skin becomes reddened/irritated stop using the CHG and inform your nurse when you arrive at Short Stay. Do not shave (including legs and underarms) for at least 48 hours prior to the first CHG shower.   Please follow these instructions carefully:  1.  Shower with CHG Soap the night before surgery and the  morning of Surgery.  2.  If you choose to wash your hair, wash your hair first as usual with your  normal  shampoo.  3.  After you shampoo, rinse your hair and body thoroughly to remove the  shampoo.                                        4.  Use CHG as you would any other liquid soap.  You can apply chg directly  to the skin and wash                       Gently with a scrungie or clean washcloth.  5.  Apply the CHG Soap to your body ONLY FROM THE NECK DOWN.   Do not use on face/ open                           Wound or open sores. Avoid contact with eyes, ears mouth and genitals (private parts).                       Wash face,  Genitals (private parts) with your normal soap.             6.  Wash thoroughly, paying special attention to the area where your surgery  will be performed.  7.  Thoroughly rinse your body with warm water from the neck down.  8.  DO NOT shower/wash with your normal soap after using and rinsing off  the CHG Soap.             9.  Pat yourself dry with a clean towel.            10.  Wear clean pajamas.            11.  Place clean sheets on your bed the night of your first shower and do not  sleep with pets. Day of Surgery : Do not apply any lotions/deodorants the morning of surgery.  Please wear clean clothes to the hospital/surgery center.  FAILURE TO FOLLOW THESE INSTRUCTIONS MAY RESULT  IN THE CANCELLATION OF YOUR SURGERY PATIENT SIGNATURE_________________________________  NURSE SIGNATURE__________________________________  ________________________________________________________________________   Kristy Rodgers  An incentive spirometer is a tool that can help keep your lungs clear and active. This tool measures how well you are filling your lungs with each breath. Taking long deep breaths may help reverse or decrease the chance of developing breathing (pulmonary) problems (especially infection) following:  A long period of time when you are unable to move or be active. BEFORE THE PROCEDURE   If the spirometer includes an indicator to show your best effort, your nurse or respiratory therapist will set it to a desired goal.  If  possible, sit up straight or lean slightly forward. Try not to slouch.  Hold the incentive spirometer in an upright position. INSTRUCTIONS FOR USE  1. Sit on the edge of your bed if possible, or sit up as far as you can in bed or on a chair. 2. Hold the incentive spirometer in an upright position. 3. Breathe out normally. 4. Place the mouthpiece in your mouth and seal your lips tightly around it. 5. Breathe in slowly and as deeply as possible, raising the piston or the ball toward the top of the column. 6. Hold your breath for 3-5 seconds or for as long as possible. Allow the piston or ball to fall to the bottom of the column. 7. Remove the mouthpiece from your mouth and breathe out normally. 8. Rest for a few seconds and repeat Steps 1 through 7 at least 10 times every 1-2 hours when you are awake. Take your time and take a few normal breaths between deep breaths. 9. The spirometer may include an indicator to show your best effort. Use the indicator as a goal to work toward during each repetition. 10. After each set of 10 deep breaths, practice coughing to be sure your lungs are clear. If you have an incision (the cut made at the time of  surgery), support your incision when coughing by placing a pillow or rolled up towels firmly against it. Once you are able to get out of bed, walk around indoors and cough well. You may stop using the incentive spirometer when instructed by your caregiver.  RISKS AND COMPLICATIONS  Take your time so you do not get dizzy or light-headed.  If you are in pain, you may need to take or ask for pain medication before doing incentive spirometry. It is harder to take a deep breath if you are having pain. AFTER USE  Rest and breathe slowly and easily.  It can be helpful to keep track of a log of your progress. Your caregiver can provide you with a simple table to help with this. If you are using the spirometer at home, follow these instructions: SEEK MEDICAL CARE IF:   You are having difficultly using the spirometer.  You have trouble using the spirometer as often as instructed.  Your pain medication is not giving enough relief while using the spirometer.  You develop fever of 100.5 F (38.1 C) or higher. SEEK IMMEDIATE MEDICAL CARE IF:   You cough up bloody sputum that had not been present before.  You develop fever of 102 F (38.9 C) or greater.  You develop worsening pain at or near the incision site. MAKE SURE YOU:   Understand these instructions.  Will watch your condition.  Will get help right away if you are not doing well or get worse. Document Released: 04/09/2007 Document Revised: 02/19/2012 Document Reviewed: 06/10/2007 [facility] Patient Information 2014 [place], [place].   ________________________________________________________________________

## 2020-11-26 ENCOUNTER — Encounter (HOSPITAL_COMMUNITY)
Admission: RE | Admit: 2020-11-26 | Discharge: 2020-11-26 | Disposition: A | Discharge status: Home / Self Care | Payer: Commercial Managed Care - PPO | Source: Ambulatory Visit | Attending: Obstetrics and Gynecology | Admitting: Obstetrics and Gynecology

## 2020-11-26 ENCOUNTER — Other Ambulatory Visit: Payer: Self-pay

## 2020-11-26 ENCOUNTER — Encounter (HOSPITAL_COMMUNITY): Payer: Self-pay

## 2020-11-26 DIAGNOSIS — Z01812 Encounter for preprocedural laboratory examination: Secondary | ICD-10-CM | POA: Insufficient documentation

## 2020-11-26 HISTORY — DX: Cardiac arrhythmia, unspecified: I49.9

## 2020-11-26 LAB — CBC
HCT: 36 % (ref 36.0–46.0)
Hemoglobin: 10.9 g/dL — ABNORMAL LOW (ref 12.0–15.0)
MCH: 26.3 pg (ref 26.0–34.0)
MCHC: 30.3 g/dL (ref 30.0–36.0)
MCV: 86.7 fL (ref 80.0–100.0)
Platelets: 322 10*3/uL (ref 150–400)
RBC: 4.15 MIL/uL (ref 3.87–5.11)
RDW: 14.5 % (ref 11.5–15.5)
WBC: 5.5 10*3/uL (ref 4.0–10.5)
nRBC: 0 % (ref 0.0–0.2)

## 2020-11-26 LAB — TYPE AND SCREEN
ABO/RH(D): O POS
Antibody Screen: NEGATIVE

## 2020-11-26 NOTE — Progress Notes (Signed)
COVID Vaccine Completed:No Date COVID Vaccine completed: COVID vaccine manufacturer: Pfizer    Moderna   [employer]   PCP - Dr. Leonette Most Cardiologist - no  Chest x-ray - no EKG - no Stress Test - no ECHO - no Cardiac Cath - no Pacemaker/ICD device last checked:NA  Sleep Study -no  CPAP -   Fasting Blood Sugar - na Checks Blood Sugar _____ times a day  Blood Thinner Instructions:na Aspirin Instructions: Last Dose:  Anesthesia review:   Patient denies shortness of breath, fever, cough and chest pain at PAT appointment yes   Patient verbalized understanding of instructions that were given to them at the PAT appointment. Patient was also instructed that they will need to review over the PAT instructions again at home before surgery.yes Pt had a seizure in July of 2021

## 2020-12-06 ENCOUNTER — Other Ambulatory Visit (HOSPITAL_COMMUNITY): Payer: Commercial Managed Care - PPO

## 2020-12-08 ENCOUNTER — Encounter (HOSPITAL_BASED_OUTPATIENT_CLINIC_OR_DEPARTMENT_OTHER): Admission: RE | Payer: Self-pay | Source: Ambulatory Visit

## 2020-12-08 ENCOUNTER — Ambulatory Visit (HOSPITAL_BASED_OUTPATIENT_CLINIC_OR_DEPARTMENT_OTHER)
Admission: RE | Admit: 2020-12-08 | Payer: Commercial Managed Care - PPO | Source: Ambulatory Visit | Admitting: Obstetrics and Gynecology

## 2020-12-08 SURGERY — HYSTERECTOMY, VAGINAL, LAPAROSCOPY-ASSISTED, WITH SALPINGECTOMY
Anesthesia: General | Laterality: Bilateral
